# Patient Record
Sex: Female | Born: 1955 | Race: White | Hispanic: No | Marital: Married | Smoking: Former smoker
Health system: Southern US, Community
[De-identification: ages and names within clinical notes are randomized; demographics above are authoritative.]

## PROBLEM LIST (undated history)

## (undated) DIAGNOSIS — K5790 Diverticulosis of intestine, part unspecified, without perforation or abscess without bleeding: Secondary | ICD-10-CM

---

## 2012-02-09 DIAGNOSIS — K635 Polyp of colon: Secondary | ICD-10-CM | POA: Insufficient documentation

## 2016-03-18 DIAGNOSIS — N951 Menopausal and female climacteric states: Secondary | ICD-10-CM | POA: Insufficient documentation

## 2017-04-14 DIAGNOSIS — M47812 Spondylosis without myelopathy or radiculopathy, cervical region: Secondary | ICD-10-CM | POA: Insufficient documentation

## 2021-08-29 ENCOUNTER — Other Ambulatory Visit: Payer: Self-pay

## 2021-08-29 ENCOUNTER — Emergency Department (HOSPITAL_BASED_OUTPATIENT_CLINIC_OR_DEPARTMENT_OTHER): Payer: BC Managed Care – PPO

## 2021-08-29 ENCOUNTER — Inpatient Hospital Stay (HOSPITAL_BASED_OUTPATIENT_CLINIC_OR_DEPARTMENT_OTHER)
Admission: EM | Admit: 2021-08-29 | Discharge: 2021-09-07 | DRG: 391 | Disposition: A | Discharge status: Home / Self Care | Payer: BC Managed Care – PPO | Attending: Internal Medicine | Admitting: Internal Medicine

## 2021-08-29 ENCOUNTER — Encounter (HOSPITAL_BASED_OUTPATIENT_CLINIC_OR_DEPARTMENT_OTHER): Payer: Self-pay

## 2021-08-29 DIAGNOSIS — Z801 Family history of malignant neoplasm of trachea, bronchus and lung: Secondary | ICD-10-CM | POA: Diagnosis not present

## 2021-08-29 DIAGNOSIS — K5732 Diverticulitis of large intestine without perforation or abscess without bleeding: Secondary | ICD-10-CM | POA: Diagnosis present

## 2021-08-29 DIAGNOSIS — Z87891 Personal history of nicotine dependence: Secondary | ICD-10-CM

## 2021-08-29 DIAGNOSIS — L259 Unspecified contact dermatitis, unspecified cause: Secondary | ICD-10-CM | POA: Diagnosis present

## 2021-08-29 DIAGNOSIS — K567 Ileus, unspecified: Secondary | ICD-10-CM | POA: Diagnosis present

## 2021-08-29 DIAGNOSIS — Z86010 Personal history of colonic polyps: Secondary | ICD-10-CM | POA: Diagnosis not present

## 2021-08-29 DIAGNOSIS — E876 Hypokalemia: Secondary | ICD-10-CM | POA: Diagnosis present

## 2021-08-29 DIAGNOSIS — Z20822 Contact with and (suspected) exposure to covid-19: Secondary | ICD-10-CM | POA: Diagnosis present

## 2021-08-29 DIAGNOSIS — K5792 Diverticulitis of intestine, part unspecified, without perforation or abscess without bleeding: Secondary | ICD-10-CM

## 2021-08-29 DIAGNOSIS — D6489 Other specified anemias: Secondary | ICD-10-CM | POA: Diagnosis present

## 2021-08-29 DIAGNOSIS — E860 Dehydration: Secondary | ICD-10-CM | POA: Diagnosis present

## 2021-08-29 DIAGNOSIS — I700 Atherosclerosis of aorta: Secondary | ICD-10-CM | POA: Diagnosis present

## 2021-08-29 DIAGNOSIS — N739 Female pelvic inflammatory disease, unspecified: Secondary | ICD-10-CM

## 2021-08-29 DIAGNOSIS — K651 Peritoneal abscess: Secondary | ICD-10-CM | POA: Diagnosis present

## 2021-08-29 DIAGNOSIS — R109 Unspecified abdominal pain: Secondary | ICD-10-CM

## 2021-08-29 DIAGNOSIS — K5720 Diverticulitis of large intestine with perforation and abscess without bleeding: Principal | ICD-10-CM | POA: Diagnosis present

## 2021-08-29 HISTORY — DX: Diverticulosis of intestine, part unspecified, without perforation or abscess without bleeding: K57.90

## 2021-08-29 LAB — CBC WITH DIFFERENTIAL/PLATELET
Abs Immature Granulocytes: 0.06 10*3/uL (ref 0.00–0.07)
Basophils Absolute: 0 10*3/uL (ref 0.0–0.1)
Basophils Relative: 0 %
Eosinophils Absolute: 0 10*3/uL (ref 0.0–0.5)
Eosinophils Relative: 0 %
HCT: 42.4 % (ref 36.0–46.0)
Hemoglobin: 14.3 g/dL (ref 12.0–15.0)
Immature Granulocytes: 0 %
Lymphocytes Relative: 5 %
Lymphs Abs: 0.7 10*3/uL (ref 0.7–4.0)
MCH: 31.6 pg (ref 26.0–34.0)
MCHC: 33.7 g/dL (ref 30.0–36.0)
MCV: 93.8 fL (ref 80.0–100.0)
Monocytes Absolute: 0.6 10*3/uL (ref 0.1–1.0)
Monocytes Relative: 4 %
Neutro Abs: 13.1 10*3/uL — ABNORMAL HIGH (ref 1.7–7.7)
Neutrophils Relative %: 91 %
Platelets: 252 10*3/uL (ref 150–400)
RBC: 4.52 MIL/uL (ref 3.87–5.11)
RDW: 11.9 % (ref 11.5–15.5)
WBC: 14.4 10*3/uL — ABNORMAL HIGH (ref 4.0–10.5)
nRBC: 0 % (ref 0.0–0.2)

## 2021-08-29 LAB — URINALYSIS, MICROSCOPIC (REFLEX)

## 2021-08-29 LAB — COMPREHENSIVE METABOLIC PANEL
ALT: 16 U/L (ref 0–44)
AST: 17 U/L (ref 15–41)
Albumin: 3.9 g/dL (ref 3.5–5.0)
Alkaline Phosphatase: 49 U/L (ref 38–126)
Anion gap: 9 (ref 5–15)
BUN: 15 mg/dL (ref 8–23)
CO2: 27 mmol/L (ref 22–32)
Calcium: 8.9 mg/dL (ref 8.9–10.3)
Chloride: 102 mmol/L (ref 98–111)
Creatinine, Ser: 0.86 mg/dL (ref 0.44–1.00)
GFR, Estimated: >60 mL/min (ref >60)
Glucose, Bld: 133 mg/dL — ABNORMAL HIGH (ref 70–99)
Potassium: 3.9 mmol/L (ref 3.5–5.1)
Sodium: 138 mmol/L (ref 135–145)
Total Bilirubin: 0.6 mg/dL (ref 0.3–1.2)
Total Protein: 7.4 g/dL (ref 6.5–8.1)

## 2021-08-29 LAB — URINALYSIS, ROUTINE W REFLEX MICROSCOPIC
Bilirubin Urine: NEGATIVE
Glucose, UA: NEGATIVE mg/dL
Ketones, ur: NEGATIVE mg/dL
Leukocytes,Ua: NEGATIVE
Nitrite: NEGATIVE
Protein, ur: NEGATIVE mg/dL
Specific Gravity, Urine: 1.01 (ref 1.005–1.030)
pH: 7 (ref 5.0–8.0)

## 2021-08-29 LAB — RESP PANEL BY RT-PCR (FLU A&B, COVID) ARPGX2
Influenza A by PCR: NEGATIVE
Influenza B by PCR: NEGATIVE
SARS Coronavirus 2 by RT PCR: NEGATIVE

## 2021-08-29 LAB — LACTIC ACID, PLASMA: Lactic Acid, Venous: 1.2 mmol/L (ref 0.5–1.9)

## 2021-08-29 LAB — LIPASE, BLOOD: Lipase: 25 U/L (ref 11–51)

## 2021-08-29 MED ORDER — ONDANSETRON HCL 4 MG/2ML IJ SOLN
4.0000 mg | Freq: Once | INTRAMUSCULAR | Status: AC
  Administered 2021-08-29: 4 mg via INTRAVENOUS
  Filled 2021-08-29: qty 2

## 2021-08-29 MED ORDER — HYDROMORPHONE HCL 1 MG/ML IJ SOLN
1.0000 mg | Freq: Once | INTRAMUSCULAR | Status: AC
  Administered 2021-08-29: 1 mg via INTRAVENOUS
  Filled 2021-08-29: qty 1

## 2021-08-29 MED ORDER — MORPHINE SULFATE (PF) 4 MG/ML IV SOLN
4.0000 mg | Freq: Once | INTRAVENOUS | Status: AC
  Administered 2021-08-29: 4 mg via INTRAVENOUS
  Filled 2021-08-29: qty 1

## 2021-08-29 MED ORDER — HYDRALAZINE HCL 20 MG/ML IJ SOLN: 10.0000 mg | Freq: Four times a day (QID) | INTRAMUSCULAR | Status: DC | PRN

## 2021-08-29 MED ORDER — PIPERACILLIN-TAZOBACTAM 3.375 G IVPB 30 MIN
3.3750 g | Freq: Once | INTRAVENOUS | Status: AC
  Administered 2021-08-29: 3.375 g via INTRAVENOUS
  Filled 2021-08-29: qty 50

## 2021-08-29 MED ORDER — LACTATED RINGERS IV BOLUS
1000.0000 mL | Freq: Once | INTRAVENOUS | Status: AC
  Administered 2021-08-29: 1000 mL via INTRAVENOUS

## 2021-08-29 MED ORDER — ONDANSETRON HCL 4 MG/2ML IJ SOLN
4.0000 mg | Freq: Four times a day (QID) | INTRAMUSCULAR | Status: DC | PRN
  Administered 2021-08-29 – 2021-09-03 (×6): 4 mg via INTRAVENOUS
  Filled 2021-08-29 (×7): qty 2

## 2021-08-29 MED ORDER — BISACODYL 10 MG RE SUPP: 10.0000 mg | Freq: Every day | RECTAL | Status: DC | PRN

## 2021-08-29 MED ORDER — ACETAMINOPHEN 325 MG PO TABS
650.0000 mg | ORAL_TABLET | Freq: Four times a day (QID) | ORAL | Status: DC | PRN
  Administered 2021-09-03 – 2021-09-07 (×6): 650 mg via ORAL
  Filled 2021-08-29 (×7): qty 2

## 2021-08-29 MED ORDER — SODIUM CHLORIDE 0.9 % IV SOLN: INTRAVENOUS | Status: DC

## 2021-08-29 MED ORDER — ONDANSETRON HCL 4 MG PO TABS
4.0000 mg | ORAL_TABLET | Freq: Four times a day (QID) | ORAL | Status: DC | PRN
  Administered 2021-09-05: 4 mg via ORAL
  Filled 2021-08-29: qty 1

## 2021-08-29 MED ORDER — PIPERACILLIN-TAZOBACTAM 3.375 G IVPB 30 MIN: 3.3750 g | Freq: Three times a day (TID) | INTRAVENOUS | Status: DC

## 2021-08-29 MED ORDER — MORPHINE SULFATE (PF) 2 MG/ML IV SOLN
1.0000 mg | INTRAVENOUS | Status: DC | PRN
  Administered 2021-08-29 – 2021-08-30 (×3): 2 mg via INTRAVENOUS
  Filled 2021-08-29 (×4): qty 1

## 2021-08-29 MED ORDER — SODIUM CHLORIDE 0.9% FLUSH
3.0000 mL | Freq: Two times a day (BID) | INTRAVENOUS | Status: DC
  Administered 2021-08-30 – 2021-09-06 (×9): 3 mL via INTRAVENOUS

## 2021-08-29 MED ORDER — ACETAMINOPHEN 650 MG RE SUPP: 650.0000 mg | Freq: Four times a day (QID) | RECTAL | Status: DC | PRN

## 2021-08-29 MED ORDER — PIPERACILLIN-TAZOBACTAM 3.375 G IVPB
3.3750 g | Freq: Three times a day (TID) | INTRAVENOUS | Status: DC
  Administered 2021-08-29 – 2021-09-07 (×26): 3.375 g via INTRAVENOUS
  Filled 2021-08-29 (×27): qty 50

## 2021-08-29 MED ORDER — IOHEXOL 300 MG/ML  SOLN
100.0000 mL | Freq: Once | INTRAMUSCULAR | Status: AC | PRN
  Administered 2021-08-29: 100 mL via INTRAVENOUS

## 2021-08-29 MED ORDER — ALBUTEROL SULFATE (2.5 MG/3ML) 0.083% IN NEBU: 2.5000 mg | INHALATION_SOLUTION | RESPIRATORY_TRACT | Status: DC | PRN

## 2021-08-29 NOTE — H&P (Signed)
History and Physical    Jaclyn York VZD:[id number] DOB: 04-05-1956 DOA: 08/29/2021  PCP: Wayland Salinas, MD  Patient coming from: Home  I have personally briefly reviewed patient's old medical records in [place]  Chief Complaint: Worsening lower abdominal pain  HPI: Jaclyn York is a 65 y.o. female with medical history significant of diverticulosis per patient from last colonoscopy about 3 to 4 years ago, history of colonic polyps presenting to the ED at [facility] with a 1-2 week history of worsening cramping lower abdominal pain with a feeling of bloating and gassiness.  Patient states over the past 24 hours lower abdominal pain has worsened significantly.  Patient stated woke up this morning took some Pepto-Bismol with no significant improvement with abdominal pain.  Patient complained of significant weakness to the point whereby she could not get off the floor.  Patient does endorse some diaphoresis/clamminess, nausea, 2 episodes of bilious emesis early on this morning with no further episodes.  Patient does endorse some chills, lightheadedness, dizziness.  Patient denies any fevers, no chest pain, no shortness of breath, no diarrhea, no constipation, no melena, no hematemesis, no hematochezia.  Patient stated last bowel movement was 1 day prior to admission.  Patient endorses flatus.  Patient denies any prior history of acute diverticulitis.  ED Course: Patient seen in the ED, comprehensive metabolic profile unremarkable.  Lactic acid level at 1.2.  CBC with a leukocytosis with a white count of 14.4 otherwise within normal limits.  COVID-19 PCR and influenza PCR negative.  Urinalysis nitrite negative, leukocytes negative, 0-5 WBCs.  CT abdomen and pelvis done with findings consistent with sigmoid diverticulitis with small focal microperforation noted, concern for focal ileus, aortic atherosclerosis noted.  Patient given a dose of IV Dilaudid in the ED, received a  dose of IV Zosyn.  General surgery consulted and medical admission recommended.  Review of Systems: As per HPI otherwise all other systems reviewed and are negative.  Past Medical History:  Diagnosis Date   Diverticulosis     History reviewed. No pertinent surgical history.  Social History  reports that she has quit smoking. Her smoking use included cigarettes. She has never used smokeless tobacco. She reports current alcohol use. She reports that she does not use drugs.  No Known Allergies  Family History  Problem Relation Age of Onset   Diverticulosis Mother    Lung cancer Father    Mother alive age 31 with a history of diverticulosis, degenerative disc disease.  Father deceased age 67 from lung cancer.  Prior to Admission medications   Not on File    Physical Exam: Vitals:   08/29/21 1330 08/29/21 1400 08/29/21 1610 08/29/21 1656  BP: 110/63 97/76 116/60 103/61  Pulse: 84 96 89 78  Resp: 18 18 20 18   Temp:    98.5 F (36.9 C)  TempSrc:    Oral  SpO2: 97% 100% 99% 90%  Weight:      Height:        Constitutional: NAD, calm, comfortable.  Somewhat drowsy. Vitals:   08/29/21 1330 08/29/21 1400 08/29/21 1610 08/29/21 1656  BP: 110/63 97/76 116/60 103/61  Pulse: 84 96 89 78  Resp: 18 18 20 18   Temp:    98.5 F (36.9 C)  TempSrc:    Oral  SpO2: 97% 100% 99% 90%  Weight:      Height:       Eyes: PERRL, lids and conjunctivae normal ENMT: Mucous membranes are moist.  Minimal erythema noted in the posterior pharynx.  No exudate noted.  Normal dentition.  Neck: normal, supple, no masses, no thyromegaly Respiratory: clear to auscultation bilaterally, no wheezing, no crackles. Normal respiratory effort. No accessory muscle use.  Cardiovascular: Regular rate and rhythm, no murmurs / rubs / gallops. No extremity edema. 2+ pedal pulses. No carotid bruits.  Abdomen: Soft, nondistended, positive bowel sounds, tenderness to palpation in the lower abdominal region  bilaterally.  No rebound.  No guarding.   Musculoskeletal: no clubbing / cyanosis. No joint deformity upper and lower extremities. Good ROM, no contractures. Normal muscle tone.  Skin: no rashes, lesions, ulcers. No induration Neurologic: CN 2-12 grossly intact. Sensation intact, DTR normal. Strength 5/5 in all 4.  Psychiatric: Normal judgment and insight. Alert and oriented x 3. Normal mood.    Labs on Admission: I have personally reviewed following labs and imaging studies  CBC: Recent Labs  Lab 08/29/21 1124  WBC 14.4*  NEUTROABS 13.1*  HGB 14.3  HCT 42.4  MCV 93.8  PLT 220    Basic Metabolic Panel: Recent Labs  Lab 08/29/21 1124  NA 138  K 3.9  CL 102  CO2 27  GLUCOSE 133*  BUN 15  CREATININE 0.86  CALCIUM 8.9    GFR: Estimated Creatinine Clearance: 72.9 mL/min (by C-G formula based on SCr of 0.86 mg/dL).  Liver Function Tests: Recent Labs  Lab 08/29/21 1124  AST 17  ALT 16  ALKPHOS 49  BILITOT 0.6  PROT 7.4  ALBUMIN 3.9    Urine analysis:    Component Value Date/Time   COLORURINE YELLOW 08/29/2021 [place] 08/29/2021 1124   LABSPEC 1.010 08/29/2021 1124   PHURINE 7.0 08/29/2021 1124   GLUCOSEU NEGATIVE 08/29/2021 1124   HGBUR TRACE (A) 08/29/2021 1124   BILIRUBINUR NEGATIVE 08/29/2021 1124   [place] 08/29/2021 1124   PROTEINUR NEGATIVE 08/29/2021 1124   NITRITE NEGATIVE 08/29/2021 1124   LEUKOCYTESUR NEGATIVE 08/29/2021 1124    Radiological Exams on Admission: CT ABDOMEN PELVIS W CONTRAST  Result Date: 08/29/2021 CLINICAL DATA:  Left lower quadrant abdomen pain assess for diverticulitis. EXAM: CT ABDOMEN AND PELVIS WITH CONTRAST TECHNIQUE: Multidetector CT imaging of the abdomen and pelvis was performed using the standard protocol following bolus administration of intravenous contrast. CONTRAST:  140mL OMNIPAQUE IOHEXOL 300 MG/ML  SOLN COMPARISON:  None. FINDINGS: Lower chest: No acute abnormality. Hepatobiliary:  No focal liver abnormality is seen. No gallstones, gallbladder wall thickening, or biliary dilatation. Pancreas: Unremarkable. No pancreatic ductal dilatation or surrounding inflammatory changes. Spleen: Normal in size without focal abnormality. Adrenals/Urinary Tract: Adrenal glands are unremarkable. Kidneys are normal, without renal calculi, focal lesion, or hydronephrosis. Bladder is unremarkable. Left parapelvic renal cysts are identified. Stomach/Bowel: There is stranding inflammation surrounding the sigmoid colon with a few small foci of air adjacent to the area inflammation suggesting small focal micro perforation. Free fluid is identified in the pelvis. There is mild dilatation of the small bowel loops in the pelvis likely secondary to focal ileus. The small bowel loops are otherwise normal. The stomach is normal. The appendix is normal. Vascular/Lymphatic: Aortic atherosclerosis. No enlarged abdominal or pelvic lymph nodes. Reproductive: Uterine fibroids are noted. No definite adnexal masses are noted. Other: Free fluid in the pelvis as previously described. Musculoskeletal: Degenerative joint changes of the lower lumbar spine are identified. IMPRESSION: 1. Findings consistent with sigmoid diverticulitis with a few small foci of air adjacent to the area inflammation suggesting small focal micro perforation.  Free fluid is identified in the pelvis. 2. Mild dilatation of the small bowel loops in the pelvis likely secondary to focal ileus. 3. Aortic atherosclerosis. Aortic Atherosclerosis (ICD10-I70.0). These results were called by telephone at the time of interpretation on 08/29/2021 at 12:57 pm to provider M[facility] , who verbally acknowledged these results. Electronically Signed   By: Abelardo Diesel M.D.   On: 08/29/2021 12:57    EKG: Not done.  Assessment/Plan Principal Problem:   Sigmoid diverticulitis Active Problems:   Ileus ([place])   Dehydration   #1 acute sigmoid diverticulitis with  microperforation -Patient with history of diverticulosis (noted on colonoscopy 3 to 4 years ago per patient), no prior history of acute diverticulitis. -Keep patient n.p.o. except ice chips. -IV fluids, IV antiemetics, IV pain management. -Placed on IV Zosyn. -General surgery consulted and following.  2.  Ileus -Noted on CT abdomen and pelvis. -Bowel rest. -Mobilize. -Minimize narcotics.  3.  Dehydration IV fluids.  DVT prophylaxis: SCDs Code Status:   Full Family Communication:  Updated patient.  No family at bedside Disposition Plan:   Patient is from:  Home  Anticipated DC to:  Home  Anticipated DC date:  3 to 4 days  Anticipated DC barriers: Clinical improvement.  Consults called: General surgery: Dr. Ninfa Linden 08/29/2021 Admission status:  Admit to inpatient/telemetry  Severity of Illness: The appropriate patient status for this patient is INPATIENT. Inpatient status is judged to be reasonable and necessary in order to provide the required intensity of service to ensure the patient's safety. The patient's presenting symptoms, physical exam findings, and initial radiographic and laboratory data in the context of their chronic comorbidities is felt to place them at high risk for further clinical deterioration. Furthermore, it is not anticipated that the patient will be medically stable for discharge from the hospital within 2 midnights of admission.   * I certify that at the point of admission it is my clinical judgment that the patient will require inpatient hospital care spanning beyond 2 midnights from the point of admission due to high intensity of service, high risk for further deterioration and high frequency of surveillance required.*    Irine Seal MD [facility]  How to contact the [facility] Attending or Consulting provider [place] or covering provider during after hours [place], for this patient?   Check the care team in [facility] and look for a) attending/consulting  [facility] provider listed and b) the [facility] team listed Log into [email or web address] and use [place]'s universal password to access. If you do not have the password, please contact the hospital operator. Locate the [facility] provider you are looking for under [facility] and page to a number that you can be directly reached. If you still have difficulty reaching the provider, please page the [facility] (Director on Call) for the Hospitalists listed on amion for assistance.  08/29/2021, 6:34 PM

## 2021-08-29 NOTE — ED Provider Notes (Signed)
[place] EMERGENCY DEPARTMENT Provider Note   CSN: [id number] Arrival date & time: 08/29/21  1058     History Chief Complaint  Patient presents with   Abdominal Pain    Nausea,     Jaclyn York is a 65 y.o. female who presents the emergency department for evaluation of abdominal pain nausea and vomiting.  Patient states that for the last week and a half she has had lower crampy abdominal pain that significantly worsened over the last 24 hours.  She states she has had multiple episodes of nonbloody nonbilious vomiting and nonbloody diarrhea.  She endorses diaphoresis but no documented fevers at home.  Denies chest pain, shortness of breath, headache, cough or other systemic symptoms.   Abdominal Pain Associated symptoms: diarrhea, nausea and vomiting   Associated symptoms: no chest pain, no chills, no cough, no dysuria, no fever, no hematuria, no shortness of breath and no sore throat       History reviewed. No pertinent past medical history.  Patient Active Problem List   Diagnosis Date Noted   Sigmoid diverticulitis 08/29/2021    History reviewed. No pertinent surgical history.   OB History   No obstetric history on file.     History reviewed. No pertinent family history.     Home Medications Prior to Admission medications   Not on File    Allergies    Patient has no known allergies.  Review of Systems   Review of Systems  Constitutional:  Negative for chills and fever.  HENT:  Negative for ear pain and sore throat.   Eyes:  Negative for pain and visual disturbance.  Respiratory:  Negative for cough and shortness of breath.   Cardiovascular:  Negative for chest pain and palpitations.  Gastrointestinal:  Positive for abdominal pain, diarrhea, nausea and vomiting.  Genitourinary:  Negative for dysuria and hematuria.  Musculoskeletal:  Negative for arthralgias and back pain.  Skin:  Negative for color change and rash.  Neurological:   Negative for seizures and syncope.  All other systems reviewed and are negative.  Physical Exam Updated Vital Signs BP 97/76   Pulse 96   Temp 98.5 F (36.9 C) (Oral)   Resp 18   Ht 5\' 8"  (1.727 m)   Wt 76.7 kg   SpO2 100%   BMI 25.70 kg/m   Physical Exam Vitals and nursing note reviewed.  Constitutional:      General: She is not in acute distress.    Appearance: She is well-developed. She is ill-appearing.  HENT:     Head: Normocephalic and atraumatic.  Eyes:     Conjunctiva/sclera: Conjunctivae normal.  Cardiovascular:     Rate and Rhythm: Normal rate and regular rhythm.     Heart sounds: No murmur heard. Pulmonary:     Effort: Pulmonary effort is normal. No respiratory distress.     Breath sounds: Normal breath sounds.  Abdominal:     Palpations: Abdomen is soft.     Tenderness: There is abdominal tenderness in the right lower quadrant and left lower quadrant.  Musculoskeletal:     Cervical back: Neck supple.  Skin:    General: Skin is warm and dry.  Neurological:     Mental Status: She is alert.    ED Results / Procedures / Treatments   Labs (all labs ordered are listed, but only abnormal results are displayed) Labs Reviewed  CBC WITH DIFFERENTIAL/PLATELET - Abnormal; Notable for the following components:      Result  Value   WBC 14.4 (*)    Neutro Abs 13.1 (*)    All other components within normal limits  COMPREHENSIVE METABOLIC PANEL - Abnormal; Notable for the following components:   Glucose, Bld 133 (*)    All other components within normal limits  URINALYSIS, ROUTINE W REFLEX MICROSCOPIC - Abnormal; Notable for the following components:   Hgb urine dipstick TRACE (*)    All other components within normal limits  URINALYSIS, MICROSCOPIC (REFLEX) - Abnormal; Notable for the following components:   Bacteria, UA MANY (*)    All other components within normal limits  RESP PANEL BY RT-PCR (FLU A&B, COVID) ARPGX2  LIPASE, BLOOD  LACTIC ACID, PLASMA     EKG None  Radiology CT ABDOMEN PELVIS W CONTRAST  Result Date: 08/29/2021 CLINICAL DATA:  Left lower quadrant abdomen pain assess for diverticulitis. EXAM: CT ABDOMEN AND PELVIS WITH CONTRAST TECHNIQUE: Multidetector CT imaging of the abdomen and pelvis was performed using the standard protocol following bolus administration of intravenous contrast. CONTRAST:  165mL OMNIPAQUE IOHEXOL 300 MG/ML  SOLN COMPARISON:  None. FINDINGS: Lower chest: No acute abnormality. Hepatobiliary: No focal liver abnormality is seen. No gallstones, gallbladder wall thickening, or biliary dilatation. Pancreas: Unremarkable. No pancreatic ductal dilatation or surrounding inflammatory changes. Spleen: Normal in size without focal abnormality. Adrenals/Urinary Tract: Adrenal glands are unremarkable. Kidneys are normal, without renal calculi, focal lesion, or hydronephrosis. Bladder is unremarkable. Left parapelvic renal cysts are identified. Stomach/Bowel: There is stranding inflammation surrounding the sigmoid colon with a few small foci of air adjacent to the area inflammation suggesting small focal micro perforation. Free fluid is identified in the pelvis. There is mild dilatation of the small bowel loops in the pelvis likely secondary to focal ileus. The small bowel loops are otherwise normal. The stomach is normal. The appendix is normal. Vascular/Lymphatic: Aortic atherosclerosis. No enlarged abdominal or pelvic lymph nodes. Reproductive: Uterine fibroids are noted. No definite adnexal masses are noted. Other: Free fluid in the pelvis as previously described. Musculoskeletal: Degenerative joint changes of the lower lumbar spine are identified. IMPRESSION: 1. Findings consistent with sigmoid diverticulitis with a few small foci of air adjacent to the area inflammation suggesting small focal micro perforation. Free fluid is identified in the pelvis. 2. Mild dilatation of the small bowel loops in the pelvis likely secondary  to focal ileus. 3. Aortic atherosclerosis. Aortic Atherosclerosis (ICD10-I70.0). These results were called by telephone at the time of interpretation on 08/29/2021 at 12:57 pm to provider Jaclyn York [facility] , who verbally acknowledged these results. Electronically Signed   By: Abelardo Diesel M.D.   On: 08/29/2021 12:57    Procedures Procedures   Medications Ordered in ED Medications  morphine 4 MG/ML injection 4 mg (4 mg Intravenous Given 08/29/21 1145)  ondansetron (ZOFRAN) injection 4 mg (4 mg Intravenous Given 08/29/21 1144)  lactated ringers bolus 1,000 mL ( Intravenous Stopped 08/29/21 1301)  iohexol (OMNIPAQUE) 300 MG/ML solution 100 mL (100 mLs Intravenous Contrast Given 08/29/21 1239)  piperacillin-tazobactam (ZOSYN) IVPB 3.375 g (0 g Intravenous Stopped 08/29/21 1359)  morphine 4 MG/ML injection 4 mg (4 mg Intravenous Given 08/29/21 1404)  ondansetron (ZOFRAN) injection 4 mg (4 mg Intravenous Given 08/29/21 1403)    ED Course  I have reviewed the triage vital signs and the nursing notes.  Pertinent labs & imaging results that were available during my care of the patient were reviewed by me and considered in my medical decision making (see chart for details).  MDM Rules/Calculators/A&P                           Patient seen emergency department for evaluation of abdominal pain.  Physical exam reveals tenderness in the left lower quadrant as well as an overall ill-appearing patient.  Laboratory evaluation with a leukocytosis to 14.4 but is otherwise unremarkable.  CT abdomen pelvis with sigmoid diverticulitis with microperforation as well as a secondary focal ileus in the small bowel loops.  Patient started on Zosyn and surgery consulted who recommends medical admission and they will consult.  Patient admitted. Final Clinical Impression(s) / ED Diagnoses Final diagnoses:  Diverticulitis    Rx / DC Orders ED Discharge Orders     None        Aylani Spurlock, MD 08/29/21  1606

## 2021-08-29 NOTE — ED Provider Notes (Signed)
  Physical Exam  BP 97/76   Pulse 96   Temp 98.5 F (36.9 C) (Oral)   Resp 18   Ht 5\' 8"  (1.727 m)   Wt 76.7 kg   SpO2 100%   BMI 25.70 kg/m     ED Course/Procedures     Procedures  MDM    65 year old female presenting with abd pain, microperf'd diverticulitis, admitted to [facility]yn.  The patient's pain medicine was reordered.  [facility] came and picked up the patient and transferred her for admission.  She remained hemodynamically stable prior to transfer.       Regan Lemming, MD 08/29/21 1759

## 2021-08-29 NOTE — Consult Note (Signed)
CC: abdominal pain  Requesting provider: Dr. Irine Seal  HPI: Jaclyn York is an 65 y.o. female who is here for evaluation of diverticulitis.  She was transferred from [facility] today.  She reports that for the last 2 weeks she has been having some intermittent low, crampy abdominal pain.  It became worse over the past day.  She had several episodes of emesis and diarrhea.  The pain was moderate in intensity. She has no previous history of diverticulitis.  She undergoes endoscopy every 3 to 5 years for polyps in her colon.  She reports that is probably been at least 3 years since her last endoscopy which is usually done in [place].  She denies fevers or chills.  She is otherwise without complaints.  Past Medical History:  Diagnosis Date   Diverticulosis     History reviewed. No pertinent surgical history.  Hemorrhoidectomy  Family History  Problem Relation Age of Onset   Diverticulosis Mother    Lung cancer Father     Social:  reports that she has quit smoking. Her smoking use included cigarettes. She has never used smokeless tobacco. She reports current alcohol use. She reports that she does not use drugs.  Allergies: No Known Allergies  Medications: I have reviewed the patient's current medications.  Results for orders placed or performed during the hospital encounter of 08/29/21 (from the past 48 hour(s))  CBC with Differential     Status: Abnormal   Collection Time: 08/29/21 11:24 AM  Result Value Ref Range   WBC 14.4 (H) 4.0 - 10.5 K/uL   RBC 4.52 3.87 - 5.11 MIL/uL   Hemoglobin 14.3 12.0 - 15.0 g/dL   HCT 42.4 36.0 - 46.0 %   MCV 93.8 80.0 - 100.0 fL   MCH 31.6 26.0 - 34.0 pg   MCHC 33.7 30.0 - 36.0 g/dL   RDW 11.9 11.5 - 15.5 %   Platelets 252 150 - 400 K/uL   nRBC 0.0 0.0 - 0.2 %   Neutrophils Relative % 91 %   Neutro Abs 13.1 (H) 1.7 - 7.7 K/uL   Lymphocytes Relative 5 %   Lymphs Abs 0.7 0.7 - 4.0 K/uL   Monocytes Relative 4 %    Monocytes Absolute 0.6 0.1 - 1.0 K/uL   Eosinophils Relative 0 %   Eosinophils Absolute 0.0 0.0 - 0.5 K/uL   Basophils Relative 0 %   Basophils Absolute 0.0 0.0 - 0.1 K/uL   Immature Granulocytes 0 %   Abs Immature Granulocytes 0.06 0.00 - 0.07 K/uL    Comment: Performed at [facility], [address] [place], [place] [address]  Comprehensive metabolic panel     Status: Abnormal   Collection Time: 08/29/21 11:24 AM  Result Value Ref Range   Sodium 138 135 - 145 mmol/L   Potassium 3.9 3.5 - 5.1 mmol/L   Chloride 102 98 - 111 mmol/L   CO2 27 22 - 32 mmol/L   Glucose, Bld 133 (H) 70 - 99 mg/dL    Comment: Glucose reference range applies only to samples taken after fasting for at least 8 hours.   BUN 15 8 - 23 mg/dL   Creatinine, Ser 0.86 0.44 - 1.00 mg/dL   Calcium 8.9 8.9 - 10.3 mg/dL   Total Protein 7.4 6.5 - 8.1 g/dL   Albumin 3.9 3.5 - 5.0 g/dL   AST 17 15 - 41 U/L   ALT 16 0 - 44 U/L  Alkaline Phosphatase 49 38 - 126 U/L   Total Bilirubin 0.6 0.3 - 1.2 mg/dL   GFR, Estimated >60 >60 mL/min    Comment: (NOTE) Calculated using the CKD-EPI Creatinine Equation (2021)    Anion gap 9 5 - 15    Comment: Performed at [facility], [address] [place], [place] [address]  Lipase, blood     Status: None   Collection Time: 08/29/21 11:24 AM  Result Value Ref Range   Lipase 25 11 - 51 U/L    Comment: Performed at [facility], [address] [place], [place] [address]  Urinalysis, Routine w reflex microscopic Urine, Clean Catch     Status: Abnormal   Collection Time: 08/29/21 11:24 AM  Result Value Ref Range   Color, Urine YELLOW YELLOW   APPearance CLEAR CLEAR   Specific Gravity, Urine 1.010 1.005 - 1.030   pH 7.0 5.0 - 8.0   Glucose, UA NEGATIVE NEGATIVE mg/dL   Hgb urine dipstick TRACE (A) NEGATIVE   Bilirubin Urine NEGATIVE NEGATIVE   Ketones, ur NEGATIVE NEGATIVE mg/dL   Protein, ur NEGATIVE NEGATIVE mg/dL   Nitrite NEGATIVE NEGATIVE    Leukocytes,Ua NEGATIVE NEGATIVE    Comment: Performed at [facility], [address] [place], [place] [address]  Resp Panel by RT-PCR (Flu A&B, Covid) Nasopharyngeal Swab     Status: None   Collection Time: 08/29/21 11:24 AM   Specimen: Nasopharyngeal Swab; Nasopharyngeal(NP) swabs in vial transport medium  Result Value Ref Range   SARS Coronavirus 2 by RT PCR NEGATIVE NEGATIVE    Comment: (NOTE) SARS-CoV-2 target nucleic acids are NOT DETECTED.  The SARS-CoV-2 RNA is generally detectable in upper respiratory specimens during the acute phase of infection. The lowest concentration of SARS-CoV-2 viral copies this assay can detect is 138 copies/mL. A negative result does not preclude SARS-Cov-2 infection and should not be used as the sole basis for treatment or other patient management decisions. A negative result may occur with  improper specimen collection/handling, submission of specimen other than nasopharyngeal swab, presence of viral mutation(s) within the areas targeted by this assay, and inadequate number of viral copies(<138 copies/mL). A negative result must be combined with clinical observations, patient history, and epidemiological information. The expected result is Negative.  Fact Sheet for Patients:  EntrepreneurPulse.com.au  Fact Sheet for Healthcare Providers:  IncredibleEmployment.be  This test is no t yet approved or cleared by the [place] FDA and  has been authorized for detection and/or diagnosis of SARS-CoV-2 by FDA under an Emergency Use Authorization (EUA). This EUA will remain  in effect (meaning this test can be used) for the duration of the COVID-19 declaration under Section 564(b)(1) of the Act, 21 U.S.C.section 360bbb-3(b)(1), unless the authorization is terminated  or revoked sooner.       Influenza A by PCR NEGATIVE NEGATIVE   Influenza B by PCR NEGATIVE NEGATIVE    Comment: (NOTE) The Xpert  Xpress SARS-CoV-2/FLU/RSV plus assay is intended as an aid in the diagnosis of influenza from Nasopharyngeal swab specimens and should not be used as a sole basis for treatment. Nasal washings and aspirates are unacceptable for Xpert Xpress SARS-CoV-2/FLU/RSV testing.  Fact Sheet for Patients: EntrepreneurPulse.com.au  Fact Sheet for Healthcare Providers: IncredibleEmployment.be  This test is not yet approved or cleared by the [place] FDA and has been authorized for detection and/or diagnosis of SARS-CoV-2 by FDA under an Emergency Use Authorization (EUA). This EUA will  remain in effect (meaning this test can be used) for the duration of the COVID-19 declaration under Section 564(b)(1) of the Act, 21 U.S.C. section 360bbb-3(b)(1), unless the authorization is terminated or revoked.  Performed at [facility], [address] [place], [place] [address]   Lactic acid, plasma     Status: None   Collection Time: 08/29/21 11:24 AM  Result Value Ref Range   Lactic Acid, Venous 1.2 0.5 - 1.9 mmol/L    Comment: Performed at [facility], [address] [place], [place] [address]  Urinalysis, Microscopic (reflex)     Status: Abnormal   Collection Time: 08/29/21 11:24 AM  Result Value Ref Range   RBC / HPF 0-5 0 - 5 RBC/hpf   WBC, UA 0-5 0 - 5 WBC/hpf   Bacteria, UA MANY (A) NONE SEEN   Squamous Epithelial / LPF 6-10 0 - 5    Comment: Performed at [facility], [address] [place], [place] [address]    CT ABDOMEN PELVIS W CONTRAST  Result Date: 08/29/2021 CLINICAL DATA:  Left lower quadrant abdomen pain assess for diverticulitis. EXAM: CT ABDOMEN AND PELVIS WITH CONTRAST TECHNIQUE: Multidetector CT imaging of the abdomen and pelvis was performed using the standard protocol following bolus administration of intravenous contrast. CONTRAST:  170mL OMNIPAQUE IOHEXOL 300 MG/ML  SOLN COMPARISON:  None. FINDINGS:  Lower chest: No acute abnormality. Hepatobiliary: No focal liver abnormality is seen. No gallstones, gallbladder wall thickening, or biliary dilatation. Pancreas: Unremarkable. No pancreatic ductal dilatation or surrounding inflammatory changes. Spleen: Normal in size without focal abnormality. Adrenals/Urinary Tract: Adrenal glands are unremarkable. Kidneys are normal, without renal calculi, focal lesion, or hydronephrosis. Bladder is unremarkable. Left parapelvic renal cysts are identified. Stomach/Bowel: There is stranding inflammation surrounding the sigmoid colon with a few small foci of air adjacent to the area inflammation suggesting small focal micro perforation. Free fluid is identified in the pelvis. There is mild dilatation of the small bowel loops in the pelvis likely secondary to focal ileus. The small bowel loops are otherwise normal. The stomach is normal. The appendix is normal. Vascular/Lymphatic: Aortic atherosclerosis. No enlarged abdominal or pelvic lymph nodes. Reproductive: Uterine fibroids are noted. No definite adnexal masses are noted. Other: Free fluid in the pelvis as previously described. Musculoskeletal: Degenerative joint changes of the lower lumbar spine are identified. IMPRESSION: 1. Findings consistent with sigmoid diverticulitis with a few small foci of air adjacent to the area inflammation suggesting small focal micro perforation. Free fluid is identified in the pelvis. 2. Mild dilatation of the small bowel loops in the pelvis likely secondary to focal ileus. 3. Aortic atherosclerosis. Aortic Atherosclerosis (ICD10-I70.0). These results were called by telephone at the time of interpretation on 08/29/2021 at 12:57 pm to provider MADISON [facility] , who verbally acknowledged these results. Electronically Signed   By: Abelardo Diesel M.D.   On: 08/29/2021 12:57    ROS - all of the below systems have been reviewed with the patient and positives are indicated with bold text General:  chills, fever or night sweats Eyes: blurry vision or double vision ENT: epistaxis or sore throat Allergy/Immunology: itchy/watery eyes or nasal congestion Hematologic/Lymphatic: bleeding problems, blood clots or swollen lymph nodes Endocrine: temperature intolerance or unexpected weight changes Breast: new or changing breast lumps or nipple discharge Resp: cough, shortness of breath, or wheezing CV: chest pain or dyspnea on exertion GI: as per HPI GU: dysuria, trouble voiding, or hematuria MSK: joint  pain or joint stiffness Neuro: TIA or stroke symptoms Derm: pruritus and skin lesion changes Psych: anxiety and depression  PE Blood pressure 103/61, pulse 78, temperature 98.5 F (36.9 C), temperature source Oral, resp. rate 18, height 5\' 8"  (1.727 m), weight 76.7 kg, SpO2 90 %. Constitutional: NAD; conversant; no deformities Eyes: Moist conjunctiva; no lid lag; anicteric; PERRL Lungs: Normal respiratory effort; no tactile fremitus CV: RRR; no palpable thrills; no pitting edema GI: Abd soft with tenderness and guarding in the left lower quadrant which is moderate; no palpable hepatosplenomegaly MSK: Normal range of motion of extremities; no clubbing/cyanosis Psychiatric: Appropriate affect; alert and oriented x3   Results for orders placed or performed during the hospital encounter of 08/29/21 (from the past 48 hour(s))  CBC with Differential     Status: Abnormal   Collection Time: 08/29/21 11:24 AM  Result Value Ref Range   WBC 14.4 (H) 4.0 - 10.5 K/uL   RBC 4.52 3.87 - 5.11 MIL/uL   Hemoglobin 14.3 12.0 - 15.0 g/dL   HCT 42.4 36.0 - 46.0 %   MCV 93.8 80.0 - 100.0 fL   MCH 31.6 26.0 - 34.0 pg   MCHC 33.7 30.0 - 36.0 g/dL   RDW 11.9 11.5 - 15.5 %   Platelets 252 150 - 400 K/uL   nRBC 0.0 0.0 - 0.2 %   Neutrophils Relative % 91 %   Neutro Abs 13.1 (H) 1.7 - 7.7 K/uL   Lymphocytes Relative 5 %   Lymphs Abs 0.7 0.7 - 4.0 K/uL   Monocytes Relative 4 %   Monocytes Absolute 0.6  0.1 - 1.0 K/uL   Eosinophils Relative 0 %   Eosinophils Absolute 0.0 0.0 - 0.5 K/uL   Basophils Relative 0 %   Basophils Absolute 0.0 0.0 - 0.1 K/uL   Immature Granulocytes 0 %   Abs Immature Granulocytes 0.06 0.00 - 0.07 K/uL    Comment: Performed at [facility], [address] [place], [place] [address]  Comprehensive metabolic panel     Status: Abnormal   Collection Time: 08/29/21 11:24 AM  Result Value Ref Range   Sodium 138 135 - 145 mmol/L   Potassium 3.9 3.5 - 5.1 mmol/L   Chloride 102 98 - 111 mmol/L   CO2 27 22 - 32 mmol/L   Glucose, Bld 133 (H) 70 - 99 mg/dL    Comment: Glucose reference range applies only to samples taken after fasting for at least 8 hours.   BUN 15 8 - 23 mg/dL   Creatinine, Ser 0.86 0.44 - 1.00 mg/dL   Calcium 8.9 8.9 - 10.3 mg/dL   Total Protein 7.4 6.5 - 8.1 g/dL   Albumin 3.9 3.5 - 5.0 g/dL   AST 17 15 - 41 U/L   ALT 16 0 - 44 U/L   Alkaline Phosphatase 49 38 - 126 U/L   Total Bilirubin 0.6 0.3 - 1.2 mg/dL   GFR, Estimated >60 >60 mL/min    Comment: (NOTE) Calculated using the CKD-EPI Creatinine Equation (2021)    Anion gap 9 5 - 15    Comment: Performed at [facility], [address] [place], [place] [address]  Lipase, blood     Status: None   Collection Time: 08/29/21 11:24 AM  Result Value Ref Range   Lipase 25 11 - 51 U/L    Comment: Performed at [facility], [address] [place],  [address]  Urinalysis, Routine w  reflex microscopic Urine, Clean Catch     Status: Abnormal   Collection Time: 08/29/21 11:24 AM  Result Value Ref Range   Color, Urine YELLOW YELLOW   APPearance CLEAR CLEAR   Specific Gravity, Urine 1.010 1.005 - 1.030   pH 7.0 5.0 - 8.0   Glucose, UA NEGATIVE NEGATIVE mg/dL   Hgb urine dipstick TRACE (A) NEGATIVE   Bilirubin Urine NEGATIVE NEGATIVE   Ketones, ur NEGATIVE NEGATIVE mg/dL   Protein, ur NEGATIVE NEGATIVE mg/dL   Nitrite NEGATIVE NEGATIVE   Leukocytes,Ua  NEGATIVE NEGATIVE    Comment: Performed at [facility], [address] [place], [place] [address]  Resp Panel by RT-PCR (Flu A&B, Covid) Nasopharyngeal Swab     Status: None   Collection Time: 08/29/21 11:24 AM   Specimen: Nasopharyngeal Swab; Nasopharyngeal(NP) swabs in vial transport medium  Result Value Ref Range   SARS Coronavirus 2 by RT PCR NEGATIVE NEGATIVE    Comment: (NOTE) SARS-CoV-2 target nucleic acids are NOT DETECTED.  The SARS-CoV-2 RNA is generally detectable in upper respiratory specimens during the acute phase of infection. The lowest concentration of SARS-CoV-2 viral copies this assay can detect is 138 copies/mL. A negative result does not preclude SARS-Cov-2 infection and should not be used as the sole basis for treatment or other patient management decisions. A negative result may occur with  improper specimen collection/handling, submission of specimen other than nasopharyngeal swab, presence of viral mutation(s) within the areas targeted by this assay, and inadequate number of viral copies(<138 copies/mL). A negative result must be combined with clinical observations, patient history, and epidemiological information. The expected result is Negative.  Fact Sheet for Patients:  EntrepreneurPulse.com.au  Fact Sheet for Healthcare Providers:  IncredibleEmployment.be  This test is no t yet approved or cleared by the [place] FDA and  has been authorized for detection and/or diagnosis of SARS-CoV-2 by FDA under an Emergency Use Authorization (EUA). This EUA will remain  in effect (meaning this test can be used) for the duration of the COVID-19 declaration under Section 564(b)(1) of the Act, 21 U.S.C.section 360bbb-3(b)(1), unless the authorization is terminated  or revoked sooner.       Influenza A by PCR NEGATIVE NEGATIVE   Influenza B by PCR NEGATIVE NEGATIVE    Comment: (NOTE) The Xpert Xpress  SARS-CoV-2/FLU/RSV plus assay is intended as an aid in the diagnosis of influenza from Nasopharyngeal swab specimens and should not be used as a sole basis for treatment. Nasal washings and aspirates are unacceptable for Xpert Xpress SARS-CoV-2/FLU/RSV testing.  Fact Sheet for Patients: EntrepreneurPulse.com.au  Fact Sheet for Healthcare Providers: IncredibleEmployment.be  This test is not yet approved or cleared by the [place] FDA and has been authorized for detection and/or diagnosis of SARS-CoV-2 by FDA under an Emergency Use Authorization (EUA). This EUA will remain in effect (meaning this test can be used) for the duration of the COVID-19 declaration under Section 564(b)(1) of the Act, 21 U.S.C. section 360bbb-3(b)(1), unless the authorization is terminated or revoked.  Performed at [facility], [address] [place], [place] [address]   Lactic acid, plasma     Status: None   Collection Time: 08/29/21 11:24 AM  Result Value Ref Range   Lactic Acid, Venous 1.2 0.5 - 1.9 mmol/L    Comment: Performed at [facility], [address] [place], [place] [address]  Urinalysis, Microscopic (reflex)     Status: Abnormal   Collection Time:  08/29/21 11:24 AM  Result Value Ref Range   RBC / HPF 0-5 0 - 5 RBC/hpf   WBC, UA 0-5 0 - 5 WBC/hpf   Bacteria, UA MANY (A) NONE SEEN   Squamous Epithelial / LPF 6-10 0 - 5    Comment: Performed at [facility], [address] [place], [place] [address]    CT ABDOMEN PELVIS W CONTRAST  Result Date: 08/29/2021 CLINICAL DATA:  Left lower quadrant abdomen pain assess for diverticulitis. EXAM: CT ABDOMEN AND PELVIS WITH CONTRAST TECHNIQUE: Multidetector CT imaging of the abdomen and pelvis was performed using the standard protocol following bolus administration of intravenous contrast. CONTRAST:  16mL OMNIPAQUE IOHEXOL 300 MG/ML  SOLN COMPARISON:  None. FINDINGS: Lower  chest: No acute abnormality. Hepatobiliary: No focal liver abnormality is seen. No gallstones, gallbladder wall thickening, or biliary dilatation. Pancreas: Unremarkable. No pancreatic ductal dilatation or surrounding inflammatory changes. Spleen: Normal in size without focal abnormality. Adrenals/Urinary Tract: Adrenal glands are unremarkable. Kidneys are normal, without renal calculi, focal lesion, or hydronephrosis. Bladder is unremarkable. Left parapelvic renal cysts are identified. Stomach/Bowel: There is stranding inflammation surrounding the sigmoid colon with a few small foci of air adjacent to the area inflammation suggesting small focal micro perforation. Free fluid is identified in the pelvis. There is mild dilatation of the small bowel loops in the pelvis likely secondary to focal ileus. The small bowel loops are otherwise normal. The stomach is normal. The appendix is normal. Vascular/Lymphatic: Aortic atherosclerosis. No enlarged abdominal or pelvic lymph nodes. Reproductive: Uterine fibroids are noted. No definite adnexal masses are noted. Other: Free fluid in the pelvis as previously described. Musculoskeletal: Degenerative joint changes of the lower lumbar spine are identified. IMPRESSION: 1. Findings consistent with sigmoid diverticulitis with a few small foci of air adjacent to the area inflammation suggesting small focal micro perforation. Free fluid is identified in the pelvis. 2. Mild dilatation of the small bowel loops in the pelvis likely secondary to focal ileus. 3. Aortic atherosclerosis. Aortic Atherosclerosis (ICD10-I70.0). These results were called by telephone at the time of interpretation on 08/29/2021 at 12:57 pm to provider MADISON [facility] , who verbally acknowledged these results. Electronically Signed   By: Abelardo Diesel M.D.   On: 08/29/2021 12:57     A/P: Danay Mckellar is an 66 y.o. female with sigmoid diverticulitis with microperforation  I reviewed her CT scan of the  abdomen and pelvis.  She has an elevated white blood count and signs of microperforation on the scan.  I have discussed the diagnosis with the patient and her daughter in the room.  We discussed the multiple treatment modalities for diverticulitis.  The plan right now would be to attempt conservative management with IV antibiotics and bowel rest.  Should she acutely worsen, she would require emergent surgery with resection and a colostomy.  Hopefully she will improve without the need for surgery.  We will follow her closely.  Coralie Keens, MD [facility] Use [email or web address] to contact on call provider

## 2021-08-29 NOTE — ED Notes (Signed)
Pt aware of UA but unable to urinate at this time.

## 2021-08-29 NOTE — ED Triage Notes (Signed)
Pt states abdominal cramping, for over a week, nausea beginning today, vomited x2, denies diarrhea.  LBM yesterday.

## 2021-08-30 ENCOUNTER — Inpatient Hospital Stay (HOSPITAL_COMMUNITY): Payer: BC Managed Care – PPO

## 2021-08-30 LAB — COMPREHENSIVE METABOLIC PANEL
ALT: 14 U/L (ref 0–44)
AST: 13 U/L — ABNORMAL LOW (ref 15–41)
Albumin: 3.2 g/dL — ABNORMAL LOW (ref 3.5–5.0)
Alkaline Phosphatase: 39 U/L (ref 38–126)
Anion gap: 6 (ref 5–15)
BUN: 17 mg/dL (ref 8–23)
CO2: 29 mmol/L (ref 22–32)
Calcium: 8.4 mg/dL — ABNORMAL LOW (ref 8.9–10.3)
Chloride: 106 mmol/L (ref 98–111)
Creatinine, Ser: 0.85 mg/dL (ref 0.44–1.00)
GFR, Estimated: >60 mL/min (ref >60)
Glucose, Bld: 111 mg/dL — ABNORMAL HIGH (ref 70–99)
Potassium: 4 mmol/L (ref 3.5–5.1)
Sodium: 141 mmol/L (ref 135–145)
Total Bilirubin: 0.9 mg/dL (ref 0.3–1.2)
Total Protein: 6.3 g/dL — ABNORMAL LOW (ref 6.5–8.1)

## 2021-08-30 LAB — CBC WITH DIFFERENTIAL/PLATELET
Abs Immature Granulocytes: 0.05 10*3/uL (ref 0.00–0.07)
Basophils Absolute: 0 10*3/uL (ref 0.0–0.1)
Basophils Relative: 0 %
Eosinophils Absolute: 0 10*3/uL (ref 0.0–0.5)
Eosinophils Relative: 0 %
HCT: 37.3 % (ref 36.0–46.0)
Hemoglobin: 12.6 g/dL (ref 12.0–15.0)
Immature Granulocytes: 0 %
Lymphocytes Relative: 9 %
Lymphs Abs: 1.3 10*3/uL (ref 0.7–4.0)
MCH: 32 pg (ref 26.0–34.0)
MCHC: 33.8 g/dL (ref 30.0–36.0)
MCV: 94.7 fL (ref 80.0–100.0)
Monocytes Absolute: 0.6 10*3/uL (ref 0.1–1.0)
Monocytes Relative: 4 %
Neutro Abs: 11.7 10*3/uL — ABNORMAL HIGH (ref 1.7–7.7)
Neutrophils Relative %: 87 %
Platelets: 220 10*3/uL (ref 150–400)
RBC: 3.94 MIL/uL (ref 3.87–5.11)
RDW: 11.9 % (ref 11.5–15.5)
WBC: 13.7 10*3/uL — ABNORMAL HIGH (ref 4.0–10.5)
nRBC: 0 % (ref 0.0–0.2)

## 2021-08-30 LAB — PROTIME-INR
INR: 1.2 (ref 0.8–1.2)
Prothrombin Time: 15 seconds (ref 11.4–15.2)

## 2021-08-30 LAB — HIV ANTIBODY (ROUTINE TESTING W REFLEX): HIV Screen 4th Generation wRfx: NONREACTIVE

## 2021-08-30 LAB — MAGNESIUM: Magnesium: 2.1 mg/dL (ref 1.7–2.4)

## 2021-08-30 MED ORDER — SENNOSIDES-DOCUSATE SODIUM 8.6-50 MG PO TABS
1.0000 | ORAL_TABLET | Freq: Two times a day (BID) | ORAL | Status: DC
  Administered 2021-08-30 – 2021-09-07 (×15): 1 via ORAL
  Filled 2021-08-30 (×17): qty 1

## 2021-08-30 MED ORDER — MORPHINE SULFATE (PF) 2 MG/ML IV SOLN
2.0000 mg | INTRAVENOUS | Status: DC | PRN
  Administered 2021-08-30 – 2021-08-31 (×4): 4 mg via INTRAVENOUS
  Administered 2021-08-31 – 2021-09-01 (×4): 2 mg via INTRAVENOUS
  Filled 2021-08-30: qty 2
  Filled 2021-08-30: qty 1
  Filled 2021-08-30 (×3): qty 2
  Filled 2021-08-30 (×2): qty 1
  Filled 2021-08-30: qty 2

## 2021-08-30 NOTE — Progress Notes (Signed)
Subjective/Chief Complaint: Feels better than when she came in   Objective: Vital signs in last 24 hours: Temp:  [97.9 F (36.6 C)-98.5 F (36.9 C)] 98.3 F (36.8 C) (11/13 0543) Pulse Rate:  [65-96] 66 (11/13 0543) Resp:  [16-20] 18 (11/13 0543) BP: (97-135)/(53-89) 104/56 (11/13 0543) SpO2:  [90 %-100 %] 94 % (11/13 0543) Weight:  [76.7 kg] 76.7 kg (11/12 1107) Last BM Date: 08/28/21  Intake/Output from previous day: 11/12 0701 - 11/13 0700 In: 1842.1 [P.O.:30; I.V.:711.6; IV Piggyback:1100.5] Out: -  Intake/Output this shift: No intake/output data recorded.  General appearance: alert and cooperative Resp: clear to auscultation bilaterally Cardio: regular rate and rhythm GI: soft, mild tenderness  Lab Results:  Recent Labs    08/29/21 1124 08/30/21 0352  WBC 14.4* 13.7*  HGB 14.3 12.6  HCT 42.4 37.3  PLT 252 220   BMET Recent Labs    08/29/21 1124 08/30/21 0352  NA 138 141  K 3.9 4.0  CL 102 106  CO2 27 29  GLUCOSE 133* 111*  BUN 15 17  CREATININE 0.86 0.85  CALCIUM 8.9 8.4*   PT/INR Recent Labs    08/30/21 0352  LABPROT 15.0  INR 1.2   ABG No results for input(s): PHART, HCO3 in the last 72 hours.  Invalid input(s): PCO2, PO2  Studies/Results: CT ABDOMEN PELVIS W CONTRAST  Result Date: 08/29/2021 CLINICAL DATA:  Left lower quadrant abdomen pain assess for diverticulitis. EXAM: CT ABDOMEN AND PELVIS WITH CONTRAST TECHNIQUE: Multidetector CT imaging of the abdomen and pelvis was performed using the standard protocol following bolus administration of intravenous contrast. CONTRAST:  119mL OMNIPAQUE IOHEXOL 300 MG/ML  SOLN COMPARISON:  None. FINDINGS: Lower chest: No acute abnormality. Hepatobiliary: No focal liver abnormality is seen. No gallstones, gallbladder wall thickening, or biliary dilatation. Pancreas: Unremarkable. No pancreatic ductal dilatation or surrounding inflammatory changes. Spleen: Normal in size without focal abnormality.  Adrenals/Urinary Tract: Adrenal glands are unremarkable. Kidneys are normal, without renal calculi, focal lesion, or hydronephrosis. Bladder is unremarkable. Left parapelvic renal cysts are identified. Stomach/Bowel: There is stranding inflammation surrounding the sigmoid colon with a few small foci of air adjacent to the area inflammation suggesting small focal micro perforation. Free fluid is identified in the pelvis. There is mild dilatation of the small bowel loops in the pelvis likely secondary to focal ileus. The small bowel loops are otherwise normal. The stomach is normal. The appendix is normal. Vascular/Lymphatic: Aortic atherosclerosis. No enlarged abdominal or pelvic lymph nodes. Reproductive: Uterine fibroids are noted. No definite adnexal masses are noted. Other: Free fluid in the pelvis as previously described. Musculoskeletal: Degenerative joint changes of the lower lumbar spine are identified. IMPRESSION: 1. Findings consistent with sigmoid diverticulitis with a few small foci of air adjacent to the area inflammation suggesting small focal micro perforation. Free fluid is identified in the pelvis. 2. Mild dilatation of the small bowel loops in the pelvis likely secondary to focal ileus. 3. Aortic atherosclerosis. Aortic Atherosclerosis (ICD10-I70.0). These results were called by telephone at the time of interpretation on 08/29/2021 at 12:57 pm to provider MADISON [facility] , who verbally acknowledged these results. Electronically Signed   By: Abelardo Diesel M.D.   On: 08/29/2021 12:57    Anti-infectives: Anti-infectives (From admission, onward)    Start     Dose/Rate Route Frequency Ordered Stop   08/29/21 2200  piperacillin-tazobactam (ZOSYN) IVPB 3.375 g  Status:  Discontinued        3.375 g 100 mL/hr over 30  Minutes Intravenous Every 8 hours 08/29/21 1831 08/29/21 1833   08/29/21 2200  piperacillin-tazobactam (ZOSYN) IVPB 3.375 g        3.375 g 12.5 mL/hr over 240 Minutes Intravenous Every  8 hours 08/29/21 1833     08/29/21 1315  piperacillin-tazobactam (ZOSYN) IVPB 3.375 g        3.375 g 100 mL/hr over 30 Minutes Intravenous  Once 08/29/21 1307 08/29/21 1359       Assessment/Plan: s/p * No surgery found * Continue bowel rest for now Continue IV zosyn Sigmoid diverticulitis with micro perf. Seems to be tolerating well. No sign of systemic sepsis  LOS: 1 day    Jaclyn York 08/30/2021

## 2021-08-30 NOTE — Progress Notes (Signed)
PROGRESS NOTE    Jaclyn York  XNA:[id number] DOB: [birth year] DOA: 08/29/2021 PCP: Wayland Salinas, MD    Chief Complaint  Patient presents with   Abdominal Pain    Nausea,     Brief Narrative:  Patient is a pleasant 65 year old female history of diverticulosis presenting to the ED with a 1 to 2-week history of worsening cramping lower abdominal pain.  Patient noted to have episode of nausea and 2 episodes of bilious emesis on the morning of admission.  CT abdomen and pelvis done concerning for sigmoid diverticulitis with microperforation, ileus.  Patient admitted, placed on bowel rest, empiric IV antibiotics, IV pain medication, IV antiemetics.  Supportive care.  General surgery consulted and following.   Assessment & Plan:   Principal Problem:   Sigmoid diverticulitis Active Problems:   Ileus (Normal)   Dehydration  1 acute sigmoid diverticulitis with microperforation -Patient with history of diverticulosis (noted on colonoscopy 3 to 4 years ago per patient), no prior history of acute diverticulitis. -Patient still with abdominal pain and per RN 2 mg of morphine not helping with pain. -Patient given IV Dilaudid yesterday however states it made her very hot and would prefer to morphine. -Improving slowly clinically. -Abdomen soft, some tenderness to palpation. -Continue bowel rest, IV fluids, IV antiemetics, IV Zosyn, pain management. -General surgery following and appreciate input and recommendations.  2.  Ileus -Noted on CT abdomen and pelvis. -Patient passing flatus.  No bowel movement. -Bowel rest. -Try to minimize narcotics. -Mobilize. -Stool softener.  3.  Dehydration IV fluids.   DVT prophylaxis: SCDs Code Status: Full Family Communication: Updated patient.  No family at bedside. Disposition:   Status is: Inpatient  Remains inpatient appropriate because: Severity of illness.       Consultants:  General surgery: Dr. Ninfa Linden  08/29/2021  Procedures:  CT abdomen and pelvis 08/29/2021  Antimicrobials:  IV Zosyn 08/29/2021>>>>>>   Subjective: Writhling in bed. C/o abd pain with no significant change from admission. Per RN 2mg  morphine not helping.  Patient noted to have received 4 mg of IV morphine.  Denies any nausea or emesis.  Passing flatus.  No bowel movement.  Objective: Vitals:   08/29/21 1656 08/29/21 2118 08/30/21 0117 08/30/21 0543  BP: 103/61 (!) 109/59 (!) 102/54 (!) 104/56  Pulse: 78 66 65 66  Resp: 18 20 18 18   Temp: 98.5 F (36.9 C) 97.9 F (36.6 C) 98.3 F (36.8 C) 98.3 F (36.8 C)  TempSrc: Oral Oral Oral Oral  SpO2: 90% 95% 98% 94%  Weight:      Height:        Intake/Output Summary (Last 24 hours) at 08/30/2021 1118 Last data filed at 08/30/2021 0540 Gross per 24 hour  Intake 1842.12 ml  Output --  Net 1842.12 ml   Filed Weights   08/29/21 1107  Weight: 76.7 kg    Examination:  General exam: NAD. Respiratory system: Clear to auscultation. Respiratory effort normal. Cardiovascular system: S1 & S2 heard, RRR. No JVD, murmurs, rubs, gallops or clicks. No pedal edema. Gastrointestinal system: Abdomen is nondistended, soft and some tenderness to palpation lower abdominal region.  Positive bowel sounds.  No rebound.  No guarding.   Central nervous system: Alert and oriented. No focal neurological deficits. Extremities: Symmetric 5 x 5 power. Skin: No rashes, lesions or ulcers Psychiatry: Judgement and insight appear normal. Mood & affect appropriate.     Data Reviewed: I have personally reviewed following labs and imaging studies  CBC: Recent Labs  Lab 08/29/21 1124 08/30/21 0352  WBC 14.4* 13.7*  NEUTROABS 13.1* 11.7*  HGB 14.3 12.6  HCT 42.4 37.3  MCV 93.8 94.7  PLT 252 353    Basic Metabolic Panel: Recent Labs  Lab 08/29/21 1124 08/30/21 0352  NA 138 141  K 3.9 4.0  CL 102 106  CO2 27 29  GLUCOSE 133* 111*  BUN 15 17  CREATININE 0.86 0.85   CALCIUM 8.9 8.4*  MG  --  2.1    GFR: Estimated Creatinine Clearance: 73.8 mL/min (by C-G formula based on SCr of 0.85 mg/dL).  Liver Function Tests: Recent Labs  Lab 08/29/21 1124 08/30/21 0352  AST 17 13*  ALT 16 14  ALKPHOS 49 39  BILITOT 0.6 0.9  PROT 7.4 6.3*  ALBUMIN 3.9 3.2*    CBG: No results for input(s): GLUCAP in the last 168 hours.   Recent Results (from the past 240 hour(s))  Resp Panel by RT-PCR (Flu A&B, Covid) Nasopharyngeal Swab     Status: None   Collection Time: 08/29/21 11:24 AM   Specimen: Nasopharyngeal Swab; Nasopharyngeal(NP) swabs in vial transport medium  Result Value Ref Range Status   SARS Coronavirus 2 by RT PCR NEGATIVE NEGATIVE Final    Comment: (NOTE) SARS-CoV-2 target nucleic acids are NOT DETECTED.  The SARS-CoV-2 RNA is generally detectable in upper respiratory specimens during the acute phase of infection. The lowest concentration of SARS-CoV-2 viral copies this assay can detect is 138 copies/mL. A negative result does not preclude SARS-Cov-2 infection and should not be used as the sole basis for treatment or other patient management decisions. A negative result may occur with  improper specimen collection/handling, submission of specimen other than nasopharyngeal swab, presence of viral mutation(s) within the areas targeted by this assay, and inadequate number of viral copies(<138 copies/mL). A negative result must be combined with clinical observations, patient history, and epidemiological information. The expected result is Negative.  Fact Sheet for Patients:  EntrepreneurPulse.com.au  Fact Sheet for Healthcare Providers:  IncredibleEmployment.be  This test is no t yet approved or cleared by the [place] FDA and  has been authorized for detection and/or diagnosis of SARS-CoV-2 by FDA under an Emergency Use Authorization (EUA). This EUA will remain  in effect (meaning this test can  be used) for the duration of the COVID-19 declaration under Section 564(b)(1) of the Act, 21 U.S.C.section 360bbb-3(b)(1), unless the authorization is terminated  or revoked sooner.       Influenza A by PCR NEGATIVE NEGATIVE Final   Influenza B by PCR NEGATIVE NEGATIVE Final    Comment: (NOTE) The Xpert Xpress SARS-CoV-2/FLU/RSV plus assay is intended as an aid in the diagnosis of influenza from Nasopharyngeal swab specimens and should not be used as a sole basis for treatment. Nasal washings and aspirates are unacceptable for Xpert Xpress SARS-CoV-2/FLU/RSV testing.  Fact Sheet for Patients: EntrepreneurPulse.com.au  Fact Sheet for Healthcare Providers: IncredibleEmployment.be  This test is not yet approved or cleared by the [place] FDA and has been authorized for detection and/or diagnosis of SARS-CoV-2 by FDA under an Emergency Use Authorization (EUA). This EUA will remain in effect (meaning this test can be used) for the duration of the COVID-19 declaration under Section 564(b)(1) of the Act, 21 U.S.C. section 360bbb-3(b)(1), unless the authorization is terminated or revoked.  Performed at [facility], [address], [place], [place] [address]          Radiology Studies: CT ABDOMEN PELVIS W CONTRAST  Result Date: 08/29/2021 CLINICAL DATA:  Left lower quadrant abdomen pain assess for diverticulitis. EXAM: CT ABDOMEN AND PELVIS WITH CONTRAST TECHNIQUE: Multidetector CT imaging of the abdomen and pelvis was performed using the standard protocol following bolus administration of intravenous contrast. CONTRAST:  188mL OMNIPAQUE IOHEXOL 300 MG/ML  SOLN COMPARISON:  None. FINDINGS: Lower chest: No acute abnormality. Hepatobiliary: No focal liver abnormality is seen. No gallstones, gallbladder wall thickening, or biliary dilatation. Pancreas: Unremarkable. No pancreatic ductal dilatation or surrounding inflammatory changes.  Spleen: Normal in size without focal abnormality. Adrenals/Urinary Tract: Adrenal glands are unremarkable. Kidneys are normal, without renal calculi, focal lesion, or hydronephrosis. Bladder is unremarkable. Left parapelvic renal cysts are identified. Stomach/Bowel: There is stranding inflammation surrounding the sigmoid colon with a few small foci of air adjacent to the area inflammation suggesting small focal micro perforation. Free fluid is identified in the pelvis. There is mild dilatation of the small bowel loops in the pelvis likely secondary to focal ileus. The small bowel loops are otherwise normal. The stomach is normal. The appendix is normal. Vascular/Lymphatic: Aortic atherosclerosis. No enlarged abdominal or pelvic lymph nodes. Reproductive: Uterine fibroids are noted. No definite adnexal masses are noted. Other: Free fluid in the pelvis as previously described. Musculoskeletal: Degenerative joint changes of the lower lumbar spine are identified. IMPRESSION: 1. Findings consistent with sigmoid diverticulitis with a few small foci of air adjacent to the area inflammation suggesting small focal micro perforation. Free fluid is identified in the pelvis. 2. Mild dilatation of the small bowel loops in the pelvis likely secondary to focal ileus. 3. Aortic atherosclerosis. Aortic Atherosclerosis (ICD10-I70.0). These results were called by telephone at the time of interpretation on 08/29/2021 at 12:57 pm to provider MADISON [facility] , who verbally acknowledged these results. Electronically Signed   By: Abelardo Diesel M.D.   On: 08/29/2021 12:57        Scheduled Meds:  senna-docusate  1 tablet Oral BID   sodium chloride flush  3 mL Intravenous Q12H   Continuous Infusions:  sodium chloride 125 mL/hr at 08/29/21 1954   piperacillin-tazobactam (ZOSYN)  IV 3.375 g (08/30/21 0539)     LOS: 1 day    Time spent: 40 mins    Irine Seal, MD [facility]   To contact the attending provider  between 7A-7P or the covering provider during after hours 7P-7A, please log into the web site [email or web address] and access using universal [place] password for that web site. If you do not have the password, please call the hospital operator.  08/30/2021, 11:18 AM

## 2021-08-31 LAB — CBC WITH DIFFERENTIAL/PLATELET
Abs Immature Granulocytes: 0.08 10*3/uL — ABNORMAL HIGH (ref 0.00–0.07)
Basophils Absolute: 0 10*3/uL (ref 0.0–0.1)
Basophils Relative: 0 %
Eosinophils Absolute: 0 10*3/uL (ref 0.0–0.5)
Eosinophils Relative: 0 %
HCT: 34.3 % — ABNORMAL LOW (ref 36.0–46.0)
Hemoglobin: 11.3 g/dL — ABNORMAL LOW (ref 12.0–15.0)
Immature Granulocytes: 1 %
Lymphocytes Relative: 11 %
Lymphs Abs: 1.4 10*3/uL (ref 0.7–4.0)
MCH: 31.9 pg (ref 26.0–34.0)
MCHC: 32.9 g/dL (ref 30.0–36.0)
MCV: 96.9 fL (ref 80.0–100.0)
Monocytes Absolute: 0.7 10*3/uL (ref 0.1–1.0)
Monocytes Relative: 5 %
Neutro Abs: 10.9 10*3/uL — ABNORMAL HIGH (ref 1.7–7.7)
Neutrophils Relative %: 83 %
Platelets: 195 10*3/uL (ref 150–400)
RBC: 3.54 MIL/uL — ABNORMAL LOW (ref 3.87–5.11)
RDW: 12.2 % (ref 11.5–15.5)
WBC: 13.1 10*3/uL — ABNORMAL HIGH (ref 4.0–10.5)
nRBC: 0 % (ref 0.0–0.2)

## 2021-08-31 LAB — BASIC METABOLIC PANEL
Anion gap: 7 (ref 5–15)
BUN: 18 mg/dL (ref 8–23)
CO2: 26 mmol/L (ref 22–32)
Calcium: 7.9 mg/dL — ABNORMAL LOW (ref 8.9–10.3)
Chloride: 105 mmol/L (ref 98–111)
Creatinine, Ser: 0.9 mg/dL (ref 0.44–1.00)
GFR, Estimated: >60 mL/min (ref >60)
Glucose, Bld: 75 mg/dL (ref 70–99)
Potassium: 3.9 mmol/L (ref 3.5–5.1)
Sodium: 138 mmol/L (ref 135–145)

## 2021-08-31 MED ORDER — OXYCODONE HCL 5 MG PO TABS: 5.0000 mg | ORAL_TABLET | ORAL | Status: DC | PRN

## 2021-08-31 NOTE — Progress Notes (Signed)
Subjective: CC: Patient reports 2 weeks of abdominal pain prior to admission.  She reports that yesterday she had nausea as well as persistent moderate/severe left lower quadrant abdominal pain.  She reports this is much improved today and denies any nausea or vomiting. Pain is worse with movement and palpation. She reports she is passing flatus and had a small BM that was liquidy this a.m.  Objective: Vital signs in last 24 hours: Temp:  [98.4 F (36.9 C)-100.2 F (37.9 C)] 100.2 F (37.9 C) (11/14 0450) Pulse Rate:  [67-82] 82 (11/14 0450) Resp:  [18-20] 20 (11/14 0450) BP: (104-119)/(53-59) 119/59 (11/14 0450) SpO2:  [89 %-97 %] 89 % (11/14 0450) Weight:  [74.2 kg] 74.2 kg (11/14 0450) Last BM Date: 08/28/21  Intake/Output from previous day: 11/13 0701 - 11/14 0700 In: 616.4 [I.V.:566.4; IV Piggyback:50] Out: -  Intake/Output this shift: No intake/output data recorded.  PE: Gen:  Alert, NAD, pleasant HEENT: EOM's intact, pupils equal and round Card:  Reg Pulm:  Normal rate and effort Abd: Soft, mild distension, LLQ>suprapubic tenderness without rigidity or guarding. Slightly hypoactive bowel sounds Ext:  No LE edema Psych: A&Ox3  Skin: no rashes noted, warm and dry  Lab Results:  Recent Labs    08/30/21 0352 08/31/21 0341  WBC 13.7* 13.1*  HGB 12.6 11.3*  HCT 37.3 34.3*  PLT 220 195   BMET Recent Labs    08/30/21 0352 08/31/21 0341  NA 141 138  K 4.0 3.9  CL 106 105  CO2 29 26  GLUCOSE 111* 75  BUN 17 18  CREATININE 0.85 0.90  CALCIUM 8.4* 7.9*   PT/INR Recent Labs    08/30/21 0352  LABPROT 15.0  INR 1.2   CMP     Component Value Date/Time   NA 138 08/31/2021 0341   K 3.9 08/31/2021 0341   CL 105 08/31/2021 0341   CO2 26 08/31/2021 0341   GLUCOSE 75 08/31/2021 0341   BUN 18 08/31/2021 0341   CREATININE 0.90 08/31/2021 0341   CALCIUM 7.9 (L) 08/31/2021 0341   PROT 6.3 (L) 08/30/2021 0352   ALBUMIN 3.2 (L) 08/30/2021 0352   AST  13 (L) 08/30/2021 0352   ALT 14 08/30/2021 0352   ALKPHOS 39 08/30/2021 0352   BILITOT 0.9 08/30/2021 0352   GFRNONAA >60 08/31/2021 0341   Lipase     Component Value Date/Time   LIPASE 25 08/29/2021 1124    Studies/Results: CT ABDOMEN PELVIS W CONTRAST  Result Date: 08/29/2021 CLINICAL DATA:  Left lower quadrant abdomen pain assess for diverticulitis. EXAM: CT ABDOMEN AND PELVIS WITH CONTRAST TECHNIQUE: Multidetector CT imaging of the abdomen and pelvis was performed using the standard protocol following bolus administration of intravenous contrast. CONTRAST:  1110mL OMNIPAQUE IOHEXOL 300 MG/ML  SOLN COMPARISON:  None. FINDINGS: Lower chest: No acute abnormality. Hepatobiliary: No focal liver abnormality is seen. No gallstones, gallbladder wall thickening, or biliary dilatation. Pancreas: Unremarkable. No pancreatic ductal dilatation or surrounding inflammatory changes. Spleen: Normal in size without focal abnormality. Adrenals/Urinary Tract: Adrenal glands are unremarkable. Kidneys are normal, without renal calculi, focal lesion, or hydronephrosis. Bladder is unremarkable. Left parapelvic renal cysts are identified. Stomach/Bowel: There is stranding inflammation surrounding the sigmoid colon with a few small foci of air adjacent to the area inflammation suggesting small focal micro perforation. Free fluid is identified in the pelvis. There is mild dilatation of the small bowel loops in the pelvis likely secondary to focal ileus. The small  bowel loops are otherwise normal. The stomach is normal. The appendix is normal. Vascular/Lymphatic: Aortic atherosclerosis. No enlarged abdominal or pelvic lymph nodes. Reproductive: Uterine fibroids are noted. No definite adnexal masses are noted. Other: Free fluid in the pelvis as previously described. Musculoskeletal: Degenerative joint changes of the lower lumbar spine are identified. IMPRESSION: 1. Findings consistent with sigmoid diverticulitis with a few  small foci of air adjacent to the area inflammation suggesting small focal micro perforation. Free fluid is identified in the pelvis. 2. Mild dilatation of the small bowel loops in the pelvis likely secondary to focal ileus. 3. Aortic atherosclerosis. Aortic Atherosclerosis (ICD10-I70.0). These results were called by telephone at the time of interpretation on 08/29/2021 at 12:57 pm to provider MADISON [facility] , who verbally acknowledged these results. Electronically Signed   By: Abelardo Diesel M.D.   On: 08/29/2021 12:57    Anti-infectives: Anti-infectives (From admission, onward)    Start     Dose/Rate Route Frequency Ordered Stop   08/29/21 2200  piperacillin-tazobactam (ZOSYN) IVPB 3.375 g  Status:  Discontinued        3.375 g 100 mL/hr over 30 Minutes Intravenous Every 8 hours 08/29/21 1831 08/29/21 1833   08/29/21 2200  piperacillin-tazobactam (ZOSYN) IVPB 3.375 g        3.375 g 12.5 mL/hr over 240 Minutes Intravenous Every 8 hours 08/29/21 1833     08/29/21 1315  piperacillin-tazobactam (ZOSYN) IVPB 3.375 g        3.375 g 100 mL/hr over 30 Minutes Intravenous  Once 08/29/21 1307 08/29/21 1359        Assessment/Plan Sigmoid diverticulitis with microperforation - Pain improved. WBC downtrending (14.4 > 13.7 > 13.1) - Cont IV abx  - Start CLD - Follow exam, WBC and fever curve.  Discussed with patient that hopefully she will improve with conservative therapies.  We discussed the possibility of repeat CAT scan to determine if she was developing a intra-abdominal abscess that may require percutaneous drainage if she is not improving by HD 4-5. We also discussed if she was to fail to improve with conservative therapy she may require surgery that would likely result in a colectomy and colostomy. Patient states understanding of this. - This is the patient's first episode of diverticulitis. She reports she gets colonoscopies every 3 to 5 years for polyps in her colon.  She reports that is  probably been at least 3 years since her last endoscopy which is usually done in [place].  FEN - CLD, IVF VTE - SCDs, okay for chemical prophylaxis from a general surgery standpoint ID - Zosyn   LOS: 2 days    Jillyn Ledger , [facility] 08/31/2021, 10:07 AM Please see Amion for pager number during day hours 7:00am-4:30pm

## 2021-08-31 NOTE — Progress Notes (Signed)
PROGRESS NOTE    Jaclyn York  JJK:[id number] DOB: 17-Oct-1956 DOA: 08/29/2021 PCP: Wayland Salinas, MD    Chief Complaint  Patient presents with   Abdominal Pain    Nausea,     Brief Narrative:  Patient is a pleasant 65 year old female history of diverticulosis presenting to the ED with a 1 to 2-week history of worsening cramping lower abdominal pain.  Patient noted to have episode of nausea and 2 episodes of bilious emesis on the morning of admission.  CT abdomen and pelvis done concerning for sigmoid diverticulitis with microperforation, ileus.  Patient admitted, placed on bowel rest, empiric IV antibiotics, IV pain medication, IV antiemetics.  Supportive care.  General surgery consulted and following.   Assessment & Plan:   Principal Problem:   Sigmoid diverticulitis Active Problems:   Ileus ([place])   Dehydration  1 acute sigmoid diverticulitis with microperforation -Patient with history of diverticulosis (noted on colonoscopy 3 to 4 years ago per patient), no prior history of acute diverticulitis. -Patient improving clinically with some abdominal discomfort.   -IV pain medication changed from IV Dilaudid to IV morphine as needed.  -States had a smear for bowel movement.   -Patient started on clears per general surgery.   -Continue IV fluids, IV antiemetics, IV Zosyn, pain management  -General surgery following and appreciate input and recommendations.    2.  Ileus -Noted on CT abdomen and pelvis. -Patient passing flatus.  States had a smear for bowel movement this morning -Started on clears per general surgery. -Try to minimize narcotics. -Mobilize. -Stool softener.  3.  Dehydration IV fluids. -If tolerating clears will likely discontinue IV fluids tomorrow.   DVT prophylaxis: SCDs Code Status: Full Family Communication: Updated patient.  No family at bedside. Disposition:   Status is: Inpatient  Remains inpatient appropriate because: Severity of  illness.       Consultants:  General surgery: Dr. Ninfa Linden 08/29/2021  Procedures:  CT abdomen and pelvis 08/29/2021  Antimicrobials:  IV Zosyn 08/29/2021>>>>>>   Subjective: Sitting up in bed looks more comfortable today.  Tolerating clears.  No chest pain.  No shortness of breath.  Feels abdominal pain improving since admission.  No nausea or emesis.  Complain of some abdominal discomfort.   Objective: Vitals:   08/30/21 0543 08/30/21 1235 08/30/21 2055 08/31/21 0450  BP: (!) 104/56 (!) 104/53 (!) 109/58 (!) 119/59  Pulse: 66 67 76 82  Resp: 18 18 20 20   Temp: 98.3 F (36.8 C) 98.6 F (37 C) 98.4 F (36.9 C) 100.2 F (37.9 C)  TempSrc: Oral Axillary Oral Oral  SpO2: 94% 97% 95% (!) 89%  Weight:    74.2 kg  Height:        Intake/Output Summary (Last 24 hours) at 08/31/2021 1253 Last data filed at 08/30/2021 1430 Gross per 24 hour  Intake 616.37 ml  Output --  Net 616.37 ml    Filed Weights   08/29/21 1107 08/31/21 0450  Weight: 76.7 kg 74.2 kg    Examination:  General exam: : NAD Respiratory system: CTA B.  No wheezes, no rhonchi.  Speaking in full sentences.  Normal respiratory effort. Cardiovascular system: Regular rate and rhythm no murmurs rubs or gallops.  No JVD.  No lower extremity edema.  Gastrointestinal system: Abdomen soft, nondistended, some tenderness to palpation in the lower abdominal region.  Positive bowel sounds.  No rebound.  No guarding.  Central nervous system: Alert and oriented. No focal neurological deficits. Extremities: Symmetric 5 x 5  power. Skin: No rashes, lesions or ulcers Psychiatry: Judgement and insight appear normal. Mood & affect appropriate.   Data Reviewed: I have personally reviewed following labs and imaging studies  CBC: Recent Labs  Lab 08/29/21 1124 08/30/21 0352 08/31/21 0341  WBC 14.4* 13.7* 13.1*  NEUTROABS 13.1* 11.7* 10.9*  HGB 14.3 12.6 11.3*  HCT 42.4 37.3 34.3*  MCV 93.8 94.7 96.9  PLT 252 220  195     Basic Metabolic Panel: Recent Labs  Lab 08/29/21 1124 08/30/21 0352 08/31/21 0341  NA 138 141 138  K 3.9 4.0 3.9  CL 102 106 105  CO2 27 29 26   GLUCOSE 133* 111* 75  BUN 15 17 18   CREATININE 0.86 0.85 0.90  CALCIUM 8.9 8.4* 7.9*  MG  --  2.1  --      GFR: Estimated Creatinine Clearance: 64.5 mL/min (by C-G formula based on SCr of 0.9 mg/dL).  Liver Function Tests: Recent Labs  Lab 08/29/21 1124 08/30/21 0352  AST 17 13*  ALT 16 14  ALKPHOS 49 39  BILITOT 0.6 0.9  PROT 7.4 6.3*  ALBUMIN 3.9 3.2*     CBG: No results for input(s): GLUCAP in the last 168 hours.   Recent Results (from the past 240 hour(s))  Resp Panel by RT-PCR (Flu A&B, Covid) Nasopharyngeal Swab     Status: None   Collection Time: 08/29/21 11:24 AM   Specimen: Nasopharyngeal Swab; Nasopharyngeal(NP) swabs in vial transport medium  Result Value Ref Range Status   SARS Coronavirus 2 by RT PCR NEGATIVE NEGATIVE Final    Comment: (NOTE) SARS-CoV-2 target nucleic acids are NOT DETECTED.  The SARS-CoV-2 RNA is generally detectable in upper respiratory specimens during the acute phase of infection. The lowest concentration of SARS-CoV-2 viral copies this assay can detect is 138 copies/mL. A negative result does not preclude SARS-Cov-2 infection and should not be used as the sole basis for treatment or other patient management decisions. A negative result may occur with  improper specimen collection/handling, submission of specimen other than nasopharyngeal swab, presence of viral mutation(s) within the areas targeted by this assay, and inadequate number of viral copies(<138 copies/mL). A negative result must be combined with clinical observations, patient history, and epidemiological information. The expected result is Negative.  Fact Sheet for Patients:  EntrepreneurPulse.com.au  Fact Sheet for Healthcare Providers:  IncredibleEmployment.be  This  test is no t yet approved or cleared by the [place] FDA and  has been authorized for detection and/or diagnosis of SARS-CoV-2 by FDA under an Emergency Use Authorization (EUA). This EUA will remain  in effect (meaning this test can be used) for the duration of the COVID-19 declaration under Section 564(b)(1) of the Act, 21 U.S.C.section 360bbb-3(b)(1), unless the authorization is terminated  or revoked sooner.       Influenza A by PCR NEGATIVE NEGATIVE Final   Influenza B by PCR NEGATIVE NEGATIVE Final    Comment: (NOTE) The Xpert Xpress SARS-CoV-2/FLU/RSV plus assay is intended as an aid in the diagnosis of influenza from Nasopharyngeal swab specimens and should not be used as a sole basis for treatment. Nasal washings and aspirates are unacceptable for Xpert Xpress SARS-CoV-2/FLU/RSV testing.  Fact Sheet for Patients: EntrepreneurPulse.com.au  Fact Sheet for Healthcare Providers: IncredibleEmployment.be  This test is not yet approved or cleared by the [place] FDA and has been authorized for detection and/or diagnosis of SARS-CoV-2 by FDA under an Emergency Use Authorization (EUA). This EUA will remain in effect (meaning this  test can be used) for the duration of the COVID-19 declaration under Section 564(b)(1) of the Act, 21 U.S.C. section 360bbb-3(b)(1), unless the authorization is terminated or revoked.  Performed at [facility], [address] [place], [place] [address]           Radiology Studies: No results found.      Scheduled Meds:  senna-docusate  1 tablet Oral BID   sodium chloride flush  3 mL Intravenous Q12H   Continuous Infusions:  sodium chloride 125 mL/hr at 08/31/21 0110   piperacillin-tazobactam (ZOSYN)  IV 3.375 g (08/31/21 0555)     LOS: 2 days    Time spent: 40 mins    Irine Seal, MD [facility]   To contact the attending provider between 7A-7P or the  covering provider during after hours 7P-7A, please log into the web site [email or web address] and access using universal [place] password for that web site. If you do not have the password, please call the hospital operator.  08/31/2021, 12:53 PM

## 2021-08-31 NOTE — TOC Initial Note (Signed)
Transition of Care [facility]) - Initial/Assessment Note    Patient Details  Name: Jaclyn York MRN: [id number] Date of Birth: 02/08/1956  [facility] CM/SW Contact:    Leeroy Cha, RN Phone Number: 08/31/2021, 10:35 AM  Clinical Narrative:                  65 year old female history of diverticulosis presenting to the ED with a 1 to 2-week history of worsening cramping lower abdominal pain.  Patient noted to have episode of nausea and 2 episodes of bilious emesis on the morning of admission.  CT abdomen and pelvis done concerning for sigmoid diverticulitis with microperforation, ileus.  Patient admitted, placed on bowel rest, empiric IV antibiotics, IV pain medication, IV antiemetics.  Supportive care.  General surgery consulted and following.     Assessment & Plan:   Principal Problem:   Sigmoid diverticulitis Active Problems:   Ileus ([place])   Dehydration   1 acute sigmoid diverticulitis with microperforation -Patient with history of diverticulosis (noted on colonoscopy 3 to 4 years ago per patient), no prior history of acute diverticulitis. -Patient still with abdominal pain and per RN 2 mg of morphine not helping with pain. -Patient given IV Dilaudid yesterday however states it made her very hot and would prefer to morphine. -Improving slowly clinically. -Abdomen soft, some tenderness to palpation. -Continue bowel rest, IV fluids, IV antiemetics, IV Zosyn, pain management. -General surgery following and appreciate input and recommendations.  2.  Ileus -Noted on CT abdomen and pelvis. -Patient passing flatus.  No bowel movement. -Bowel rest. -Try to minimize narcotics. -Mobilize. -Stool softener.  Toc plan of care: Will follow for outcome of ileus, and hhc needs  Expected Discharge Plan: Home/Self Care Barriers to Discharge: Continued Medical Work up   Patient Goals and CMS Choice Patient states their goals for this hospitalization and ongoing  recovery are:: to go home CMS Medicare.gov Compare Post Acute Care list provided to:: Patient Choice offered to / list presented to : Patient  Expected Discharge Plan and Services Expected Discharge Plan: Home/Self Care   Discharge Planning Services: CM Consult   Living arrangements for the past 2 months: Single Family Home                                      Prior Living Arrangements/Services Living arrangements for the past 2 months: Single Family Home Lives with:: Spouse Patient language and need for interpreter reviewed:: Yes Do you feel safe going back to the place where you live?: Yes            Criminal Activity/Legal Involvement Pertinent to Current Situation/Hospitalization: No - Comment as needed  Activities of Daily Living Home Assistive Devices/Equipment: None ADL Screening (condition at time of admission) Patient's cognitive ability adequate to safely complete daily activities?: Yes Is the patient deaf or have difficulty hearing?: No Does the patient have difficulty seeing, even when wearing glasses/contacts?: No Does the patient have difficulty concentrating, remembering, or making decisions?: No Patient able to express need for assistance with ADLs?: Yes Does the patient have difficulty dressing or bathing?: No Independently performs ADLs?: Yes (appropriate for developmental age) Does the patient have difficulty walking or climbing stairs?: No Weakness of Legs: None Weakness of Arms/Hands: None  Permission Sought/Granted                  Emotional Assessment Appearance:: Appears stated  age Attitude/Demeanor/Rapport: Engaged Affect (typically observed): Calm Orientation: : Oriented to Place, Oriented to  Time, Oriented to Self, Oriented to Situation Alcohol / Substance Use: Not Applicable Psych Involvement: No (comment)  Admission diagnosis:  Diverticulitis [K57.92] Sigmoid diverticulitis [K57.32] Patient Active Problem List   Diagnosis  Date Noted   Sigmoid diverticulitis 08/29/2021   Ileus ([place]) 08/29/2021   Dehydration 08/29/2021   PCP:  Wayland Salinas, MD Pharmacy:   [id number] [place], [address] [place] [address] Phone: [phone] Fax: [phone]     Social Determinants of Health (SDOH) Interventions    Readmission Risk Interventions No flowsheet data found.

## 2021-09-01 LAB — CBC
HCT: 34.2 % — ABNORMAL LOW (ref 36.0–46.0)
Hemoglobin: 11.4 g/dL — ABNORMAL LOW (ref 12.0–15.0)
MCH: 31.8 pg (ref 26.0–34.0)
MCHC: 33.3 g/dL (ref 30.0–36.0)
MCV: 95.3 fL (ref 80.0–100.0)
Platelets: 198 10*3/uL (ref 150–400)
RBC: 3.59 MIL/uL — ABNORMAL LOW (ref 3.87–5.11)
RDW: 12.1 % (ref 11.5–15.5)
WBC: 12.2 10*3/uL — ABNORMAL HIGH (ref 4.0–10.5)
nRBC: 0 % (ref 0.0–0.2)

## 2021-09-01 LAB — MAGNESIUM: Magnesium: 2 mg/dL (ref 1.7–2.4)

## 2021-09-01 LAB — BASIC METABOLIC PANEL
Anion gap: 8 (ref 5–15)
BUN: 10 mg/dL (ref 8–23)
CO2: 23 mmol/L (ref 22–32)
Calcium: 7.8 mg/dL — ABNORMAL LOW (ref 8.9–10.3)
Chloride: 107 mmol/L (ref 98–111)
Creatinine, Ser: 0.78 mg/dL (ref 0.44–1.00)
GFR, Estimated: >60 mL/min (ref >60)
Glucose, Bld: 81 mg/dL (ref 70–99)
Potassium: 3.8 mmol/L (ref 3.5–5.1)
Sodium: 138 mmol/L (ref 135–145)

## 2021-09-01 MED ORDER — TRAMADOL HCL 50 MG PO TABS
50.0000 mg | ORAL_TABLET | Freq: Four times a day (QID) | ORAL | Status: DC | PRN
Start: 2021-09-01 — End: 2021-09-07
  Administered 2021-09-01 – 2021-09-03 (×5): 50 mg via ORAL
  Administered 2021-09-04 (×2): 100 mg via ORAL
  Administered 2021-09-04 – 2021-09-05 (×2): 50 mg via ORAL
  Administered 2021-09-06: 100 mg via ORAL
  Administered 2021-09-06: 50 mg via ORAL
  Filled 2021-09-01: qty 1
  Filled 2021-09-01 (×3): qty 2
  Filled 2021-09-01 (×3): qty 1
  Filled 2021-09-01: qty 2
  Filled 2021-09-01 (×4): qty 1

## 2021-09-01 MED ORDER — MORPHINE SULFATE (PF) 2 MG/ML IV SOLN
2.0000 mg | INTRAVENOUS | Status: DC | PRN
  Administered 2021-09-03: 11:00:00 2 mg via INTRAVENOUS
  Filled 2021-09-01: qty 1

## 2021-09-01 MED ORDER — TRAMADOL HCL 50 MG PO TABS
50.0000 mg | ORAL_TABLET | Freq: Four times a day (QID) | ORAL | Status: DC | PRN
  Administered 2021-09-01: 50 mg via ORAL
  Filled 2021-09-01: qty 1

## 2021-09-01 MED ORDER — ENOXAPARIN SODIUM 40 MG/0.4ML IJ SOSY
40.0000 mg | PREFILLED_SYRINGE | INTRAMUSCULAR | Status: DC
  Administered 2021-09-01 – 2021-09-06 (×5): 40 mg via SUBCUTANEOUS
  Filled 2021-09-01 (×5): qty 0.4

## 2021-09-01 NOTE — Progress Notes (Signed)
PROGRESS NOTE    Jaclyn York  FMB:[id number] DOB: Nov 17, 1955 DOA: 08/29/2021 PCP: Wayland Salinas, MD    Chief Complaint  Patient presents with   Abdominal Pain    Nausea,     Brief Narrative:  Patient is a pleasant 65 year old female history of diverticulosis presenting to the ED with a 1 to 2-week history of worsening cramping lower abdominal pain.  Patient noted to have episode of nausea and 2 episodes of bilious emesis on the morning of admission.  CT abdomen and pelvis done concerning for sigmoid diverticulitis with microperforation, ileus.  Patient admitted, placed on bowel rest, empiric IV antibiotics, IV pain medication, IV antiemetics.  Supportive care.  General surgery consulted and following.   Assessment & Plan:   Principal Problem:   Sigmoid diverticulitis Active Problems:   Ileus ([place])   Dehydration  1 acute sigmoid diverticulitis with microperforation -Patient with history of diverticulosis (noted on colonoscopy 3 to 4 years ago per patient), no prior history of acute diverticulitis. -Patient improving clinically with some abdominal discomfort.   -IV pain medication changed from IV Dilaudid to IV morphine as needed.  -Patient passing flatus.  Had bowel movement.   -States developed some nausea with oxycodone.   -Diet advanced to full liquid diet.   -Continue IV antiemetics, IV antibiotics with Zosyn, pain management.  -Discontinue oxycodone, trial of Ultram.   -Saline lock IV fluids.   -Mobilize.   -General surgery following and appreciate input and recommendations.    2.  Ileus -Noted on CT abdomen and pelvis. -Patient passing flatus.  Stated had a bowel movement.   -Tolerating clears.   -Diet advanced to full liquid diet per general surgery.   -Try to minimize narcotics.   -Mobilize.    3.  Dehydration -Tolerating clears.   -Improved with IV fluids.   -Saline lock IV fluids.     DVT prophylaxis: SCDs Code Status: Full Family  Communication: Updated patient.  No family at bedside. Disposition:   Status is: Inpatient  Remains inpatient appropriate because: Severity of illness.       Consultants:  General surgery: Dr. Ninfa Linden 08/29/2021  Procedures:  CT abdomen and pelvis 08/29/2021  Antimicrobials:  IV Zosyn 08/29/2021>>>>>>   Subjective: Laying in bed.  Overall feeling better.  Abdominal pain improving.  No nausea or vomiting.  Tolerated clear liquids.  Diet advanced to full liquids per general surgery.  Patient states oxycodone causes nausea.  States had a bowel movement.  Objective: Vitals:   08/31/21 2031 09/01/21 0500 09/01/21 0542 09/01/21 0759  BP: 131/68  128/68 133/64  Pulse: 83  77 73  Resp: 16  16 16   Temp: 99.7 F (37.6 C)  98.7 F (37.1 C) 98.1 F (36.7 C)  TempSrc: Oral   Oral  SpO2: 94%  96% 97%  Weight:  74.3 kg    Height:        Intake/Output Summary (Last 24 hours) at 09/01/2021 1315 Last data filed at 09/01/2021 1243 Gross per 24 hour  Intake 2071.99 ml  Output --  Net 2071.99 ml    Filed Weights   08/29/21 1107 08/31/21 0450 09/01/21 0500  Weight: 76.7 kg 74.2 kg 74.3 kg    Examination:  General exam: : NAD Respiratory system: CTAB.  No wheezes, no rhonchi.  Speaking in full sentences.  Normal respiratory effort. Cardiovascular system: Regular rate and rhythm no murmurs rubs or gallops.  No JVD.  No lower extremity edema.  Gastrointestinal system: Abdomen soft, nondistended, positive  bowel sounds, some tenderness to palpation in the lower abdominal region and upper abdominal region.  No rebound.  No guarding.  Central nervous system: Alert and oriented. No focal neurological deficits. Extremities: Symmetric 5 x 5 power. Skin: No rashes, lesions or ulcers Psychiatry: Judgement and insight appear normal. Mood & affect appropriate.  Data Reviewed: I have personally reviewed following labs and imaging studies  CBC: Recent Labs  Lab 08/29/21 1124  08/30/21 0352 08/31/21 0341 09/01/21 0340  WBC 14.4* 13.7* 13.1* 12.2*  NEUTROABS 13.1* 11.7* 10.9*  --   HGB 14.3 12.6 11.3* 11.4*  HCT 42.4 37.3 34.3* 34.2*  MCV 93.8 94.7 96.9 95.3  PLT 252 220 195 198     Basic Metabolic Panel: Recent Labs  Lab 08/29/21 1124 08/30/21 0352 08/31/21 0341 09/01/21 0340  NA 138 141 138 138  K 3.9 4.0 3.9 3.8  CL 102 106 105 107  CO2 27 29 26 23   GLUCOSE 133* 111* 75 81  BUN 15 17 18 10   CREATININE 0.86 0.85 0.90 0.78  CALCIUM 8.9 8.4* 7.9* 7.8*  MG  --  2.1  --  2.0     GFR: Estimated Creatinine Clearance: 72.6 mL/min (by C-G formula based on SCr of 0.78 mg/dL).  Liver Function Tests: Recent Labs  Lab 08/29/21 1124 08/30/21 0352  AST 17 13*  ALT 16 14  ALKPHOS 49 39  BILITOT 0.6 0.9  PROT 7.4 6.3*  ALBUMIN 3.9 3.2*     CBG: No results for input(s): GLUCAP in the last 168 hours.   Recent Results (from the past 240 hour(s))  Resp Panel by RT-PCR (Flu A&B, Covid) Nasopharyngeal Swab     Status: None   Collection Time: 08/29/21 11:24 AM   Specimen: Nasopharyngeal Swab; Nasopharyngeal(NP) swabs in vial transport medium  Result Value Ref Range Status   SARS Coronavirus 2 by RT PCR NEGATIVE NEGATIVE Final    Comment: (NOTE) SARS-CoV-2 target nucleic acids are NOT DETECTED.  The SARS-CoV-2 RNA is generally detectable in upper respiratory specimens during the acute phase of infection. The lowest concentration of SARS-CoV-2 viral copies this assay can detect is 138 copies/mL. A negative result does not preclude SARS-Cov-2 infection and should not be used as the sole basis for treatment or other patient management decisions. A negative result may occur with  improper specimen collection/handling, submission of specimen other than nasopharyngeal swab, presence of viral mutation(s) within the areas targeted by this assay, and inadequate number of viral copies(<138 copies/mL). A negative result must be combined with clinical  observations, patient history, and epidemiological information. The expected result is Negative.  Fact Sheet for Patients:  EntrepreneurPulse.com.au  Fact Sheet for Healthcare Providers:  IncredibleEmployment.be  This test is no t yet approved or cleared by the [place] FDA and  has been authorized for detection and/or diagnosis of SARS-CoV-2 by FDA under an Emergency Use Authorization (EUA). This EUA will remain  in effect (meaning this test can be used) for the duration of the COVID-19 declaration under Section 564(b)(1) of the Act, 21 U.S.C.section 360bbb-3(b)(1), unless the authorization is terminated  or revoked sooner.       Influenza A by PCR NEGATIVE NEGATIVE Final   Influenza B by PCR NEGATIVE NEGATIVE Final    Comment: (NOTE) The Xpert Xpress SARS-CoV-2/FLU/RSV plus assay is intended as an aid in the diagnosis of influenza from Nasopharyngeal swab specimens and should not be used as a sole basis for treatment. Nasal washings and aspirates are unacceptable for  Xpert Xpress SARS-CoV-2/FLU/RSV testing.  Fact Sheet for Patients: EntrepreneurPulse.com.au  Fact Sheet for Healthcare Providers: IncredibleEmployment.be  This test is not yet approved or cleared by the [place] FDA and has been authorized for detection and/or diagnosis of SARS-CoV-2 by FDA under an Emergency Use Authorization (EUA). This EUA will remain in effect (meaning this test can be used) for the duration of the COVID-19 declaration under Section 564(b)(1) of the Act, 21 U.S.C. section 360bbb-3(b)(1), unless the authorization is terminated or revoked.  Performed at [facility], [address], [place], [place] [address]           Radiology Studies: No results found.      Scheduled Meds:  enoxaparin (LOVENOX) injection  30 mg Subcutaneous Q24H   senna-docusate  1 tablet Oral BID   sodium  chloride flush  3 mL Intravenous Q12H   Continuous Infusions:  sodium chloride 125 mL/hr at 09/01/21 0923   piperacillin-tazobactam (ZOSYN)  IV 3.375 g (09/01/21 0555)     LOS: 3 days    Time spent: 35 mins    Irine Seal, MD [facility]   To contact the attending provider between 7A-7P or the covering provider during after hours 7P-7A, please log into the web site [email or web address] and access using universal [place] password for that web site. If you do not have the password, please call the hospital operator.  09/01/2021, 1:15 PM

## 2021-09-01 NOTE — Progress Notes (Signed)
Subjective: CC: Patient reports pain in her lower abdomen, greater on the left side that is improved from yesterday. She reports her pain was a 7/10 yesterday and was a 4-5/10 this am before receiving pain meds. Since receiving pain medications she has no pain unless she moves in bed. She reports she is passing flatus and had a normal sized, formed bm yesterday. Tolerating cld without n/v. Ambulating.   Objective: Vital signs in last 24 hours: Temp:  [97.6 F (36.4 C)-99.7 F (37.6 C)] 98.1 F (36.7 C) (11/15 0759) Pulse Rate:  [73-88] 73 (11/15 0759) Resp:  [16-18] 16 (11/15 0759) BP: (120-133)/(64-69) 133/64 (11/15 0759) SpO2:  [94 %-100 %] 97 % (11/15 0759) Weight:  [74.3 kg] 74.3 kg (11/15 0500) Last BM Date: 08/31/21  Intake/Output from previous day: 11/14 0701 - 11/15 0700 In: 675 [I.V.:625; IV Piggyback:50] Out: -  Intake/Output this shift: Total I/O In: 917 [I.V.:917] Out: -   PE: Gen:  Alert, NAD, pleasant Pulm:  Normal rate and effort Abd: Soft, mild distension, LLQ>suprapubic tenderness that is improved from yesterday and without rigidity or guarding. + bowel sounds Psych: A&Ox3  Skin: no rashes noted, warm and dry  Lab Results:  Recent Labs    08/31/21 0341 09/01/21 0340  WBC 13.1* 12.2*  HGB 11.3* 11.4*  HCT 34.3* 34.2*  PLT 195 198   BMET Recent Labs    08/31/21 0341 09/01/21 0340  NA 138 138  K 3.9 3.8  CL 105 107  CO2 26 23  GLUCOSE 75 81  BUN 18 10  CREATININE 0.90 0.78  CALCIUM 7.9* 7.8*   PT/INR Recent Labs    08/30/21 0352  LABPROT 15.0  INR 1.2   CMP     Component Value Date/Time   NA 138 09/01/2021 0340   K 3.8 09/01/2021 0340   CL 107 09/01/2021 0340   CO2 23 09/01/2021 0340   GLUCOSE 81 09/01/2021 0340   BUN 10 09/01/2021 0340   CREATININE 0.78 09/01/2021 0340   CALCIUM 7.8 (L) 09/01/2021 0340   PROT 6.3 (L) 08/30/2021 0352   ALBUMIN 3.2 (L) 08/30/2021 0352   AST 13 (L) 08/30/2021 0352   ALT 14 08/30/2021  0352   ALKPHOS 39 08/30/2021 0352   BILITOT 0.9 08/30/2021 0352   GFRNONAA >60 09/01/2021 0340   Lipase     Component Value Date/Time   LIPASE 25 08/29/2021 1124    Studies/Results: No results found.  Anti-infectives: Anti-infectives (From admission, onward)    Start     Dose/Rate Route Frequency Ordered Stop   08/29/21 2200  piperacillin-tazobactam (ZOSYN) IVPB 3.375 g  Status:  Discontinued        3.375 g 100 mL/hr over 30 Minutes Intravenous Every 8 hours 08/29/21 1831 08/29/21 1833   08/29/21 2200  piperacillin-tazobactam (ZOSYN) IVPB 3.375 g        3.375 g 12.5 mL/hr over 240 Minutes Intravenous Every 8 hours 08/29/21 1833     08/29/21 1315  piperacillin-tazobactam (ZOSYN) IVPB 3.375 g        3.375 g 100 mL/hr over 30 Minutes Intravenous  Once 08/29/21 1307 08/29/21 1359        Assessment/Plan Sigmoid diverticulitis with microperforation - Pain and exam improved. WBC downtrending (14.4 > 13.7 > 13.1>12.2) - Cont IV abx  - Adv to FLD - Follow exam, WBC and fever curve. Discussed with patient that hopefully she will improve with conservative therapies.  We discussed the possibility of  repeat CAT scan to determine if she was developing a intra-abdominal abscess that may require percutaneous drainage if she is not improving by HD 4-5. We also discussed if she was to fail to improve with conservative therapy she may require surgery that would likely result in a colectomy and colostomy. Patient states understanding of this. - This is the patient's first episode of diverticulitis. She reports she gets colonoscopies every 3 to 5 years for polyps in her colon.  She reports that is probably been at least 3 years since her last endoscopy which is usually done in [place].   FEN - FLD, IVF per TRH VTE - SCDs, okay for chemical prophylaxis from a general surgery standpoint ID - Zosyn   LOS: 3 days    Jillyn Ledger , [facility] Surgery 09/01/2021, 8:45  AM Please see Amion for pager number during day hours 7:00am-4:30pm

## 2021-09-01 NOTE — Plan of Care (Signed)
  Problem: Health Behavior/Discharge Planning: Goal: Ability to manage health-related needs will improve Outcome: Progressing   Problem: Clinical Measurements: Goal: Ability to maintain clinical measurements within normal limits will improve Outcome: Progressing Goal: Will remain free from infection Outcome: Progressing Goal: Diagnostic test results will improve Outcome: Progressing Goal: Respiratory complications will improve Outcome: Progressing Goal: Cardiovascular complication will be avoided Outcome: Progressing   Problem: Activity: Goal: Risk for activity intolerance will decrease Outcome: Progressing   Problem: Coping: Goal: Level of anxiety will decrease Outcome: Progressing   Problem: Pain Managment: Goal: General experience of comfort will improve Outcome: Progressing   Problem: Safety: Goal: Ability to remain free from injury will improve Outcome: Progressing

## 2021-09-02 LAB — CBC
HCT: 32.5 % — ABNORMAL LOW (ref 36.0–46.0)
Hemoglobin: 10.9 g/dL — ABNORMAL LOW (ref 12.0–15.0)
MCH: 31.4 pg (ref 26.0–34.0)
MCHC: 33.5 g/dL (ref 30.0–36.0)
MCV: 93.7 fL (ref 80.0–100.0)
Platelets: 215 10*3/uL (ref 150–400)
RBC: 3.47 MIL/uL — ABNORMAL LOW (ref 3.87–5.11)
RDW: 12.1 % (ref 11.5–15.5)
WBC: 9.9 10*3/uL (ref 4.0–10.5)
nRBC: 0 % (ref 0.0–0.2)

## 2021-09-02 LAB — BASIC METABOLIC PANEL
Anion gap: 6 (ref 5–15)
BUN: <5 mg/dL — ABNORMAL LOW (ref 8–23)
CO2: 25 mmol/L (ref 22–32)
Calcium: 8 mg/dL — ABNORMAL LOW (ref 8.9–10.3)
Chloride: 108 mmol/L (ref 98–111)
Creatinine, Ser: 0.65 mg/dL (ref 0.44–1.00)
GFR, Estimated: >60 mL/min (ref >60)
Glucose, Bld: 96 mg/dL (ref 70–99)
Potassium: 3.5 mmol/L (ref 3.5–5.1)
Sodium: 139 mmol/L (ref 135–145)

## 2021-09-02 MED ORDER — SIMETHICONE 80 MG PO CHEW
80.0000 mg | CHEWABLE_TABLET | Freq: Four times a day (QID) | ORAL | Status: DC | PRN
  Administered 2021-09-02 – 2021-09-06 (×11): 80 mg via ORAL
  Filled 2021-09-02 (×11): qty 1

## 2021-09-02 MED ORDER — HYDROCORTISONE 1 % EX CREA
TOPICAL_CREAM | Freq: Four times a day (QID) | CUTANEOUS | Status: DC
  Administered 2021-09-06: 1 via TOPICAL
  Filled 2021-09-02 (×2): qty 28

## 2021-09-02 MED ORDER — ALUM & MAG HYDROXIDE-SIMETH 200-200-20 MG/5ML PO SUSP: 30.0000 mL | Freq: Four times a day (QID) | ORAL | Status: DC | PRN

## 2021-09-02 NOTE — Progress Notes (Signed)
Subjective: CC: Doing well. Didn't like the FLD menu's as she said that she has an issue with dairy and most of the FLD options had dairy. Tolerated grits and soup without n/v. Her lower abdominal pain that is greater in the LLQ is improved this am, minimal at rest and a 4/10 with movement. She continues to pass flatus and reports liquid bm's yesterday. Mobilizing well.   Objective: Vital signs in last 24 hours: Temp:  [98.5 F (36.9 C)-99 F (37.2 C)] 98.5 F (36.9 C) (11/16 0516) Pulse Rate:  [71-73] 71 (11/16 0516) Resp:  [16] 16 (11/16 0516) BP: (112-125)/(53-62) 125/62 (11/16 0516) SpO2:  [95 %-97 %] 95 % (11/16 0516) Weight:  [81 kg] 81 kg (11/16 0533) Last BM Date: 09/01/21  Intake/Output from previous day: 11/15 0701 - 11/16 0700 In: 3420.7 [P.O.:1080; I.V.:2181.9; IV Piggyback:158.7] Out: -  Intake/Output this shift: No intake/output data recorded.  PE: Gen:  Alert, NAD, pleasant Pulm:  Normal rate and effort Abd: Soft, mild distension, minimally tender in the LLQ>suprapubic tenderness that is improved from yesterday and without rigidity or guarding. + bowel sounds Psych: A&Ox3  Skin: no rashes noted, warm and dry  Lab Results:  Recent Labs    09/01/21 0340 09/02/21 0345  WBC 12.2* 9.9  HGB 11.4* 10.9*  HCT 34.2* 32.5*  PLT 198 215   BMET Recent Labs    09/01/21 0340 09/02/21 0345  NA 138 139  K 3.8 3.5  CL 107 108  CO2 23 25  GLUCOSE 81 96  BUN 10 <5*  CREATININE 0.78 0.65  CALCIUM 7.8* 8.0*   PT/INR No results for input(s): LABPROT, INR in the last 72 hours. CMP     Component Value Date/Time   NA 139 09/02/2021 0345   K 3.5 09/02/2021 0345   CL 108 09/02/2021 0345   CO2 25 09/02/2021 0345   GLUCOSE 96 09/02/2021 0345   BUN <5 (L) 09/02/2021 0345   CREATININE 0.65 09/02/2021 0345   CALCIUM 8.0 (L) 09/02/2021 0345   PROT 6.3 (L) 08/30/2021 0352   ALBUMIN 3.2 (L) 08/30/2021 0352   AST 13 (L) 08/30/2021 0352   ALT 14 08/30/2021  0352   ALKPHOS 39 08/30/2021 0352   BILITOT 0.9 08/30/2021 0352   GFRNONAA >60 09/02/2021 0345   Lipase     Component Value Date/Time   LIPASE 25 08/29/2021 1124    Studies/Results: No results found.  Anti-infectives: Anti-infectives (From admission, onward)    Start     Dose/Rate Route Frequency Ordered Stop   08/29/21 2200  piperacillin-tazobactam (ZOSYN) IVPB 3.375 g  Status:  Discontinued        3.375 g 100 mL/hr over 30 Minutes Intravenous Every 8 hours 08/29/21 1831 08/29/21 1833   08/29/21 2200  piperacillin-tazobactam (ZOSYN) IVPB 3.375 g        3.375 g 12.5 mL/hr over 240 Minutes Intravenous Every 8 hours 08/29/21 1833     08/29/21 1315  piperacillin-tazobactam (ZOSYN) IVPB 3.375 g        3.375 g 100 mL/hr over 30 Minutes Intravenous  Once 08/29/21 1307 08/29/21 1359        Assessment/Plan Sigmoid diverticulitis with microperforation - Pain and exam improved. WBC normalized. Adv to soft diet. If tolerates, okay for discharge from our standpoint with 10 total days of abx. Will reach out to primary team to let them know.  - This is the patient's first episode of diverticulitis. She reports she gets  colonoscopies every 3 to 5 years for polyps in her colon.  She reports that is probably been at least 3 years since her last endoscopy which is usually done in [place]. She has a PCP at [facility]. Would recommend GI and PCP follow up.    FEN - Soft diet  VTE - SCDs, Lovenox  ID - Zosyn 11/12 >>   LOS: 4 days    Jillyn Ledger , [facility] 09/02/2021, 8:52 AM Please see Amion for pager number during day hours 7:00am-4:30pm

## 2021-09-02 NOTE — TOC Progression Note (Signed)
Transition of Care [facility]) - Progression Note    Patient Details  Name: Jaclyn York MRN: [id number] Date of Birth: December 20, 1955  [facility] CM/SW Contact  Leeroy Cha, RN Phone Number: 09/02/2021, 9:41 AM  Clinical Narrative:    65 year old female history of diverticulosis presenting to the ED with a 1 to 2-week history of worsening cramping lower abdominal pain.  Patient noted to have episode of nausea and 2 episodes of bilious emesis on the morning of admission.  CT abdomen and pelvis done concerning for sigmoid diverticulitis with microperforation, ileus.  Patient admitted, placed on bowel rest, empiric IV antibiotics, IV pain medication, IV antiemetics.  Supportive care.  General surgery consulted and following. TOC PLAN OF CARE: FOLLOWING FOR DC NEEDS FOLLOWING FOR PROGRESSION  Expected Discharge Plan: Home/Self Care Barriers to Discharge: Continued Medical Work up  Expected Discharge Plan and Services Expected Discharge Plan: Home/Self Care   Discharge Planning Services: CM Consult   Living arrangements for the past 2 months: Single Family Home                                       Social Determinants of Health (SDOH) Interventions    Readmission Risk Interventions No flowsheet data found.

## 2021-09-02 NOTE — Plan of Care (Signed)
  Problem: Health Behavior/Discharge Planning: Goal: Ability to manage health-related needs will improve Outcome: Progressing   Problem: Clinical Measurements: Goal: Ability to maintain clinical measurements within normal limits will improve Outcome: Progressing Goal: Will remain free from infection Outcome: Progressing Goal: Diagnostic test results will improve Outcome: Progressing Goal: Respiratory complications will improve Outcome: Progressing   

## 2021-09-02 NOTE — Progress Notes (Signed)
PROGRESS NOTE    Jaclyn York  UVO:[id number] DOB: Jul 05, 1956 DOA: 08/29/2021 PCP: Wayland Salinas, MD   Chief Complaint  Patient presents with   Abdominal Pain    Nausea,     Brief Narrative:  Patient is a pleasant 65 year old female history of diverticulosis presenting to the ED with a 1 to 2-week history of worsening cramping lower abdominal pain.  Patient noted to have episode of nausea and 2 episodes of bilious emesis on the morning of admission.  CT abdomen and pelvis done concerning for sigmoid diverticulitis with microperforation, ileus.  Patient admitted, placed on bowel rest, empiric IV antibiotics, IV pain medication, IV antiemetics.  Supportive care.  General surgery consulted and following.    Assessment & Plan:   Principal Problem:   Sigmoid diverticulitis Active Problems:   Ileus ([place])   Dehydration   Acute sigmoid diverticulitis: Improving. With IV antibiotics.  Advance diet as tolerated.  If n opain or nausea, or vomiting by tomorrow , plan for discharge.  Gen surgery consulted and recommendations given.    Papular rash on the back probably contact dermatitis.  Hydrocortisone cream prn.   Dehydration improved.    DVT prophylaxis: (Lovenox) Code Status: (Full code. ) Family Communication: none at bedside.  Disposition:   Status is: Inpatient  Remains inpatient appropriate because: IV antibiotics.        Consultants:  Surgery.  Procedures: none.   Antimicrobials:  Antibiotics Given (last 72 hours)     Date/Time Action Medication Dose Rate   08/30/21 2222 New Bag/Given   piperacillin-tazobactam (ZOSYN) IVPB 3.375 g 3.375 g 12.5 mL/hr   08/31/21 0555 New Bag/Given   piperacillin-tazobactam (ZOSYN) IVPB 3.375 g 3.375 g 12.5 mL/hr   08/31/21 1620 New Bag/Given   piperacillin-tazobactam (ZOSYN) IVPB 3.375 g 3.375 g 12.5 mL/hr   08/31/21 2043 New Bag/Given   piperacillin-tazobactam (ZOSYN) IVPB 3.375 g 3.375 g 12.5 mL/hr   09/01/21  0555 New Bag/Given   piperacillin-tazobactam (ZOSYN) IVPB 3.375 g 3.375 g 12.5 mL/hr   09/01/21 1402 New Bag/Given   piperacillin-tazobactam (ZOSYN) IVPB 3.375 g 3.375 g 12.5 mL/hr   09/01/21 2109 New Bag/Given   piperacillin-tazobactam (ZOSYN) IVPB 3.375 g 3.375 g 12.5 mL/hr   09/02/21 0526 New Bag/Given   piperacillin-tazobactam (ZOSYN) IVPB 3.375 g 3.375 g 12.5 mL/hr   09/02/21 1546 New Bag/Given   piperacillin-tazobactam (ZOSYN) IVPB 3.375 g 3.375 g 12.5 mL/hr         Subjective: Pain is better, no nausea or vomiting.   Objective: Vitals:   09/01/21 2107 09/02/21 0516 09/02/21 0533 09/02/21 1547  BP: 124/61 125/62  134/64  Pulse: 73 71  78  Resp: 16 16  20   Temp: 99 F (37.2 C) 98.5 F (36.9 C)  (!) 100.8 F (38.2 C)  TempSrc: Oral Oral  Oral  SpO2: 96% 95%  98%  Weight:   81 kg   Height:        Intake/Output Summary (Last 24 hours) at 09/02/2021 1855 Last data filed at 09/02/2021 1500 Gross per 24 hour  Intake 1195.92 ml  Output --  Net 1195.92 ml   Filed Weights   08/31/21 0450 09/01/21 0500 09/02/21 0533  Weight: 74.2 kg 74.3 kg 81 kg    Examination:  General exam: Appears calm and comfortable  Respiratory system: Clear to auscultation. Respiratory effort normal. Cardiovascular system: S1 & S2 heard, RRR. No JVD, . No pedal edema. Gastrointestinal system: Abdomen is nondistended, soft and nontender.  Normal bowel sounds  heard. Central nervous system: Alert and oriented. No focal neurological deficits. Extremities: Symmetric 5 x 5 power. Skin: papular rash on he back.  Psychiatry:Mood & affect appropriate.     Data Reviewed: I have personally reviewed following labs and imaging studies  CBC: Recent Labs  Lab 08/29/21 1124 08/30/21 0352 08/31/21 0341 09/01/21 0340 09/02/21 0345  WBC 14.4* 13.7* 13.1* 12.2* 9.9  NEUTROABS 13.1* 11.7* 10.9*  --   --   HGB 14.3 12.6 11.3* 11.4* 10.9*  HCT 42.4 37.3 34.3* 34.2* 32.5*  MCV 93.8 94.7 96.9 95.3  93.7  PLT 252 220 195 198 196    Basic Metabolic Panel: Recent Labs  Lab 08/29/21 1124 08/30/21 0352 08/31/21 0341 09/01/21 0340 09/02/21 0345  NA 138 141 138 138 139  K 3.9 4.0 3.9 3.8 3.5  CL 102 106 105 107 108  CO2 27 29 26 23 25   GLUCOSE 133* 111* 75 81 96  BUN 15 17 18 10  <5*  CREATININE 0.86 0.85 0.90 0.78 0.65  CALCIUM 8.9 8.4* 7.9* 7.8* 8.0*  MG  --  2.1  --  2.0  --     GFR: Estimated Creatinine Clearance: 80.3 mL/min (by C-G formula based on SCr of 0.65 mg/dL).  Liver Function Tests: Recent Labs  Lab 08/29/21 1124 08/30/21 0352  AST 17 13*  ALT 16 14  ALKPHOS 49 39  BILITOT 0.6 0.9  PROT 7.4 6.3*  ALBUMIN 3.9 3.2*    CBG: No results for input(s): GLUCAP in the last 168 hours.   Recent Results (from the past 240 hour(s))  Resp Panel by RT-PCR (Flu A&B, Covid) Nasopharyngeal Swab     Status: None   Collection Time: 08/29/21 11:24 AM   Specimen: Nasopharyngeal Swab; Nasopharyngeal(NP) swabs in vial transport medium  Result Value Ref Range Status   SARS Coronavirus 2 by RT PCR NEGATIVE NEGATIVE Final    Comment: (NOTE) SARS-CoV-2 target nucleic acids are NOT DETECTED.  The SARS-CoV-2 RNA is generally detectable in upper respiratory specimens during the acute phase of infection. The lowest concentration of SARS-CoV-2 viral copies this assay can detect is 138 copies/mL. A negative result does not preclude SARS-Cov-2 infection and should not be used as the sole basis for treatment or other patient management decisions. A negative result may occur with  improper specimen collection/handling, submission of specimen other than nasopharyngeal swab, presence of viral mutation(s) within the areas targeted by this assay, and inadequate number of viral copies(<138 copies/mL). A negative result must be combined with clinical observations, patient history, and epidemiological information. The expected result is Negative.  Fact Sheet for Patients:   EntrepreneurPulse.com.au  Fact Sheet for Healthcare Providers:  IncredibleEmployment.be  This test is no t yet approved or cleared by the [place] FDA and  has been authorized for detection and/or diagnosis of SARS-CoV-2 by FDA under an Emergency Use Authorization (EUA). This EUA will remain  in effect (meaning this test can be used) for the duration of the COVID-19 declaration under Section 564(b)(1) of the Act, 21 U.S.C.section 360bbb-3(b)(1), unless the authorization is terminated  or revoked sooner.       Influenza A by PCR NEGATIVE NEGATIVE Final   Influenza B by PCR NEGATIVE NEGATIVE Final    Comment: (NOTE) The Xpert Xpress SARS-CoV-2/FLU/RSV plus assay is intended as an aid in the diagnosis of influenza from Nasopharyngeal swab specimens and should not be used as a sole basis for treatment. Nasal washings and aspirates are unacceptable for Xpert Xpress SARS-CoV-2/FLU/RSV  testing.  Fact Sheet for Patients: EntrepreneurPulse.com.au  Fact Sheet for Healthcare Providers: IncredibleEmployment.be  This test is not yet approved or cleared by the [place] FDA and has been authorized for detection and/or diagnosis of SARS-CoV-2 by FDA under an Emergency Use Authorization (EUA). This EUA will remain in effect (meaning this test can be used) for the duration of the COVID-19 declaration under Section 564(b)(1) of the Act, 21 U.S.C. section 360bbb-3(b)(1), unless the authorization is terminated or revoked.  Performed at [facility], [address], [place],  [address]          Radiology Studies: No results found.      Scheduled Meds:  enoxaparin (LOVENOX) injection  40 mg Subcutaneous Q24H   senna-docusate  1 tablet Oral BID   sodium chloride flush  3 mL Intravenous Q12H   Continuous Infusions:  piperacillin-tazobactam (ZOSYN)  IV 3.375 g (09/02/21 1546)      LOS: 4 days        Hosie Poisson, MD [facility]   To contact the attending provider between 7A-7P or the covering provider during after hours 7P-7A, please log into the web site [email or web address] and access using universal [place] password for that web site. If you do not have the password, please call the hospital operator.  09/02/2021, 6:55 PM

## 2021-09-02 NOTE — Progress Notes (Addendum)
Pt noted to have increased pain and cramping after each meal. Simethicone decreased and minimize pain from 8-9 to 5. Abd pain consist of cramping and deep gnawing sensation. Tramadol 50 mg also effective. Pt concerned about pain after meals. Will update MD.SRP, RN

## 2021-09-03 ENCOUNTER — Inpatient Hospital Stay (HOSPITAL_COMMUNITY): Payer: BC Managed Care – PPO

## 2021-09-03 LAB — CBC
HCT: 32.6 % — ABNORMAL LOW (ref 36.0–46.0)
Hemoglobin: 10.8 g/dL — ABNORMAL LOW (ref 12.0–15.0)
MCH: 31.1 pg (ref 26.0–34.0)
MCHC: 33.1 g/dL (ref 30.0–36.0)
MCV: 93.9 fL (ref 80.0–100.0)
Platelets: 245 10*3/uL (ref 150–400)
RBC: 3.47 MIL/uL — ABNORMAL LOW (ref 3.87–5.11)
RDW: 12.2 % (ref 11.5–15.5)
WBC: 11.2 10*3/uL — ABNORMAL HIGH (ref 4.0–10.5)
nRBC: 0 % (ref 0.0–0.2)

## 2021-09-03 LAB — BASIC METABOLIC PANEL
Anion gap: 8 (ref 5–15)
BUN: <5 mg/dL — ABNORMAL LOW (ref 8–23)
CO2: 26 mmol/L (ref 22–32)
Calcium: 8 mg/dL — ABNORMAL LOW (ref 8.9–10.3)
Chloride: 104 mmol/L (ref 98–111)
Creatinine, Ser: 0.77 mg/dL (ref 0.44–1.00)
GFR, Estimated: >60 mL/min (ref >60)
Glucose, Bld: 101 mg/dL — ABNORMAL HIGH (ref 70–99)
Potassium: 3.2 mmol/L — ABNORMAL LOW (ref 3.5–5.1)
Sodium: 138 mmol/L (ref 135–145)

## 2021-09-03 LAB — MAGNESIUM: Magnesium: 2 mg/dL (ref 1.7–2.4)

## 2021-09-03 LAB — PHOSPHORUS: Phosphorus: 2.7 mg/dL (ref 2.5–4.6)

## 2021-09-03 MED ORDER — POTASSIUM CHLORIDE CRYS ER 20 MEQ PO TBCR
40.0000 meq | EXTENDED_RELEASE_TABLET | Freq: Once | ORAL | Status: AC
  Administered 2021-09-03: 06:00:00 40 meq via ORAL
  Filled 2021-09-03: qty 2

## 2021-09-03 MED ORDER — IOHEXOL 350 MG/ML SOLN
80.0000 mL | Freq: Once | INTRAVENOUS | Status: AC | PRN
  Administered 2021-09-03: 12:00:00 80 mL via INTRAVENOUS

## 2021-09-03 MED ORDER — ENSURE ENLIVE PO LIQD
237.0000 mL | Freq: Two times a day (BID) | ORAL | Status: DC
Start: 2021-09-04 — End: 2021-09-07
  Administered 2021-09-05 – 2021-09-06 (×3): 237 mL via ORAL

## 2021-09-03 MED ORDER — MORPHINE SULFATE (PF) 2 MG/ML IV SOLN
2.0000 mg | INTRAVENOUS | Status: DC | PRN
Start: 2021-09-03 — End: 2021-09-07
  Administered 2021-09-05: 2 mg via INTRAVENOUS
  Filled 2021-09-03: qty 1

## 2021-09-03 NOTE — Progress Notes (Signed)
Lovenox held per IR, Lennette Bihari for abscess drain in am. SRP, RN

## 2021-09-03 NOTE — Progress Notes (Signed)
PROGRESS NOTE    Jaclyn York  URK:[id number] DOB: 01-15-1956 DOA: 08/29/2021 PCP: Wayland Salinas, MD   Chief Complaint  Patient presents with   Abdominal Pain    Nausea,     Brief Narrative:  Patient is a pleasant 64 year old female history of diverticulosis presenting to the ED with a 1 to 2-week history of worsening cramping lower abdominal pain.  Patient noted to have episode of nausea and 2 episodes of bilious emesis on the morning of admission.  CT abdomen and pelvis done concerning for sigmoid diverticulitis with microperforation, ileus.  Patient admitted, placed on bowel rest, empiric IV antibiotics, IV pain medication, IV antiemetics.  Supportive care.  General surgery consulted and following.     Assessment & Plan:   Principal Problem:   Sigmoid diverticulitis Active Problems:   Ileus ([place])   Dehydration   Acute sigmoid diverticulitis with microperforation, with resultant multiple abscesses on repeat CT abdomen and pelvis.  Gen surgery consulted, await recommendations.  Continue with IV fluids and IV zosyn. Conservative therapies were unsuccessful.  Pain control with IV morphine and Oral tramadol Febrile overnight, worsening leukocytosis. Continue with NPO.    Papular rash on the back probably contact dermatitis.  Hydrocortisone cream prn.   Dehydration improved.   Hypokalemia:  Replaced.  Check magnesium levels.  Recheck potassium in am.    DVT prophylaxis: (Lovenox) Code Status: (Full code. ) Family Communication: none at bedside.  Disposition:   Status is: Inpatient  Remains inpatient appropriate because: IV antibiotics.        Consultants:  Surgery.  Procedures: none.   Antimicrobials:  Antibiotics Given (last 72 hours)     Date/Time Action Medication Dose Rate   08/31/21 1620 New Bag/Given   piperacillin-tazobactam (ZOSYN) IVPB 3.375 g 3.375 g 12.5 mL/hr   08/31/21 2043 New Bag/Given   piperacillin-tazobactam (ZOSYN) IVPB  3.375 g 3.375 g 12.5 mL/hr   09/01/21 0555 New Bag/Given   piperacillin-tazobactam (ZOSYN) IVPB 3.375 g 3.375 g 12.5 mL/hr   09/01/21 1402 New Bag/Given   piperacillin-tazobactam (ZOSYN) IVPB 3.375 g 3.375 g 12.5 mL/hr   09/01/21 2109 New Bag/Given   piperacillin-tazobactam (ZOSYN) IVPB 3.375 g 3.375 g 12.5 mL/hr   09/02/21 0526 New Bag/Given   piperacillin-tazobactam (ZOSYN) IVPB 3.375 g 3.375 g 12.5 mL/hr   09/02/21 1546 New Bag/Given   piperacillin-tazobactam (ZOSYN) IVPB 3.375 g 3.375 g 12.5 mL/hr   09/02/21 2211 New Bag/Given   piperacillin-tazobactam (ZOSYN) IVPB 3.375 g 3.375 g 12.5 mL/hr   09/03/21 0549 New Bag/Given   piperacillin-tazobactam (ZOSYN) IVPB 3.375 g 3.375 g 12.5 mL/hr   09/03/21 1429 New Bag/Given   piperacillin-tazobactam (ZOSYN) IVPB 3.375 g 3.375 g 12.5 mL/hr         Subjective: Persistent abdominal pain , with some nausea, no vomiting.    Objective: Vitals:   09/02/21 1547 09/02/21 2014 09/03/21 0510 09/03/21 0545  BP: 134/64 126/67 123/69   Pulse: 78 79 71   Resp: 20 16 16    Temp: (!) 100.8 F (38.2 C) 99.5 F (37.5 C) 98.8 F (37.1 C)   TempSrc: Oral Oral Oral   SpO2: 98% 96% 96%   Weight:    79.8 kg  Height:        Intake/Output Summary (Last 24 hours) at 09/03/2021 1457 Last data filed at 09/03/2021 0600 Gross per 24 hour  Intake 634.87 ml  Output --  Net 634.87 ml    Filed Weights   09/01/21 0500 09/02/21 0533 09/03/21 0545  Weight: 74.3 kg 81 kg 79.8 kg    Examination:  General exam: Appears calm and comfortable  Respiratory system: Clear to auscultation. Respiratory effort normal. Cardiovascular system: S1 & S2 heard, RRR. No JVD, No pedal edema. Gastrointestinal system: Abdomen is firm,  tender in the lower quadrant and bowel sounds minimal.  Central nervous system: Alert and oriented. No focal neurological deficits. Extremities: Symmetric 5 x 5 power. Skin: No rashes, lesions or ulcers Psychiatry: Mood & affect  appropriate.     Data Reviewed: I have personally reviewed following labs and imaging studies  CBC: Recent Labs  Lab 08/29/21 1124 08/30/21 0352 08/31/21 0341 09/01/21 0340 09/02/21 0345 09/03/21 0332  WBC 14.4* 13.7* 13.1* 12.2* 9.9 11.2*  NEUTROABS 13.1* 11.7* 10.9*  --   --   --   HGB 14.3 12.6 11.3* 11.4* 10.9* 10.8*  HCT 42.4 37.3 34.3* 34.2* 32.5* 32.6*  MCV 93.8 94.7 96.9 95.3 93.7 93.9  PLT 252 220 195 198 215 245     Basic Metabolic Panel: Recent Labs  Lab 08/30/21 0352 08/31/21 0341 09/01/21 0340 09/02/21 0345 09/03/21 0332  NA 141 138 138 139 138  K 4.0 3.9 3.8 3.5 3.2*  CL 106 105 107 108 104  CO2 29 26 23 25 26   GLUCOSE 111* 75 81 96 101*  BUN 17 18 10  <5* <5*  CREATININE 0.85 0.90 0.78 0.65 0.77  CALCIUM 8.4* 7.9* 7.8* 8.0* 8.0*  MG 2.1  --  2.0  --   --   PHOS  --   --   --   --  2.7     GFR: Estimated Creatinine Clearance: 79.9 mL/min (by C-G formula based on SCr of 0.77 mg/dL).  Liver Function Tests: Recent Labs  Lab 08/29/21 1124 08/30/21 0352  AST 17 13*  ALT 16 14  ALKPHOS 49 39  BILITOT 0.6 0.9  PROT 7.4 6.3*  ALBUMIN 3.9 3.2*     CBG: No results for input(s): GLUCAP in the last 168 hours.   Recent Results (from the past 240 hour(s))  Resp Panel by RT-PCR (Flu A&B, Covid) Nasopharyngeal Swab     Status: None   Collection Time: 08/29/21 11:24 AM   Specimen: Nasopharyngeal Swab; Nasopharyngeal(NP) swabs in vial transport medium  Result Value Ref Range Status   SARS Coronavirus 2 by RT PCR NEGATIVE NEGATIVE Final    Comment: (NOTE) SARS-CoV-2 target nucleic acids are NOT DETECTED.  The SARS-CoV-2 RNA is generally detectable in upper respiratory specimens during the acute phase of infection. The lowest concentration of SARS-CoV-2 viral copies this assay can detect is 138 copies/mL. A negative result does not preclude SARS-Cov-2 infection and should not be used as the sole basis for treatment or other patient management  decisions. A negative result may occur with  improper specimen collection/handling, submission of specimen other than nasopharyngeal swab, presence of viral mutation(s) within the areas targeted by this assay, and inadequate number of viral copies(<138 copies/mL). A negative result must be combined with clinical observations, patient history, and epidemiological information. The expected result is Negative.  Fact Sheet for Patients:  EntrepreneurPulse.com.au  Fact Sheet for Healthcare Providers:  IncredibleEmployment.be  This test is no t yet approved or cleared by the [place] FDA and  has been authorized for detection and/or diagnosis of SARS-CoV-2 by FDA under an Emergency Use Authorization (EUA). This EUA will remain  in effect (meaning this test can be used) for the duration of the COVID-19 declaration under Section 564(b)(1) of  the Act, 21 U.S.C.section 360bbb-3(b)(1), unless the authorization is terminated  or revoked sooner.       Influenza A by PCR NEGATIVE NEGATIVE Final   Influenza B by PCR NEGATIVE NEGATIVE Final    Comment: (NOTE) The Xpert Xpress SARS-CoV-2/FLU/RSV plus assay is intended as an aid in the diagnosis of influenza from Nasopharyngeal swab specimens and should not be used as a sole basis for treatment. Nasal washings and aspirates are unacceptable for Xpert Xpress SARS-CoV-2/FLU/RSV testing.  Fact Sheet for Patients: EntrepreneurPulse.com.au  Fact Sheet for Healthcare Providers: IncredibleEmployment.be  This test is not yet approved or cleared by the [place] FDA and has been authorized for detection and/or diagnosis of SARS-CoV-2 by FDA under an Emergency Use Authorization (EUA). This EUA will remain in effect (meaning this test can be used) for the duration of the COVID-19 declaration under Section 564(b)(1) of the Act, 21 U.S.C. section 360bbb-3(b)(1), unless the  authorization is terminated or revoked.  Performed at [facility], [address], [place], [place] [address]           Radiology Studies: CT ABDOMEN PELVIS W CONTRAST  Result Date: 09/03/2021 CLINICAL DATA:  Follow-up sigmoid diverticulitis. Patient with increasing pain, fever and rising white blood cell count. EXAM: CT ABDOMEN AND PELVIS WITH CONTRAST TECHNIQUE: Multidetector CT imaging of the abdomen and pelvis was performed using the standard protocol following bolus administration of intravenous contrast. CONTRAST:  60mL OMNIPAQUE IOHEXOL 350 MG/ML SOLN COMPARISON:  CT scan 08/29/2021 FINDINGS: Lower chest: New small bilateral pleural effusions and overlying bibasilar atelectasis. Hepatobiliary: No hepatic lesions or intrahepatic biliary dilatation. The gallbladder is unremarkable. No common bile duct dilatation. Pancreas: No mass, inflammation or ductal dilatation. Spleen: Normal size.  No focal lesions. Adrenals/Urinary Tract: Adrenal glands and kidneys are unremarkable and stable. Stable left-sided parapelvic renal cyst. The bladder is grossly. Stomach/Bowel: The stomach, duodenum and small bowel are unremarkable. No inflammatory changes or obstructive findings. The terminal ileum is normal. The appendix is. Persistent changes of acute diverticulitis involving the mid to lower sigmoid colon. There is now a more clearly defined intramural abscess measuring 2.8 x 2.3 cm. There are also multiple pelvic abscesses, new since the prior study. The largest abscess is in the cul-de-sac region and measures 7.6 x 5.5 cm. Anterior to the uterus there is a smaller abscess measuring 4.3 cm. This may communicate with the cul-de-sac abscess when seen on the sagittal images (image 65/7). In the left adnexal region there is a small abscess measuring 2.3 cm. Vascular/Lymphatic: Stable aortic and iliac artery calcifications. No aneurysm or dissection. The branch vessels are patent. The major venous  structures are patent. No mesenteric or retroperitoneal mass or adenopathy. There are small likely reactive pelvic sidewall nodes Reproductive: The uterus and ovaries are surrounded by inflammation abscesses. Other: No inguinal mass or hernia.  No subcutaneous lesions. Musculoskeletal: No significant bony findings. IMPRESSION: 1. Persistent changes of acute diverticulitis involving the mid to lower sigmoid colon. 2. Multiple new pelvic abscesses as detailed above. 3. New small bilateral pleural effusions and overlying bibasilar atelectasis. 4. Aortic atherosclerosis. These results will be called to the ordering clinician or representative by the Radiologist Assistant, and communication documented in the PACS or [employer]. Aortic Atherosclerosis (ICD10-I70.0). Electronically Signed   By: Marijo Sanes M.D.   On: 09/03/2021 14:20        Scheduled Meds:  enoxaparin (LOVENOX) injection  40 mg Subcutaneous Q24H   hydrocortisone cream   Topical QID  senna-docusate  1 tablet Oral BID   sodium chloride flush  3 mL Intravenous Q12H   Continuous Infusions:  piperacillin-tazobactam (ZOSYN)  IV 3.375 g (09/03/21 1429)     LOS: 5 days        Hosie Poisson, MD [facility]   To contact the attending provider between 7A-7P or the covering provider during after hours 7P-7A, please log into the web site [email or web address] and access using universal [place] password for that web site. If you do not have the password, please call the hospital operator.  09/03/2021, 2:57 PM

## 2021-09-03 NOTE — Progress Notes (Signed)
Pt abd is tight and distended, c//o increased pain and discomfort. Medicated as ordered.MD messaged, will page.Bonna Gains, RN

## 2021-09-03 NOTE — Plan of Care (Signed)
  Problem: Health Behavior/Discharge Planning: Goal: Ability to manage health-related needs will improve Outcome: Progressing   Problem: Clinical Measurements: Goal: Ability to maintain clinical measurements within normal limits will improve Outcome: Progressing Goal: Will remain free from infection Outcome: Progressing Goal: Diagnostic test results will improve Outcome: Progressing Goal: Respiratory complications will improve Outcome: Progressing   

## 2021-09-03 NOTE — Progress Notes (Signed)
Subjective: CC: Patient reports after being placed on the soft diet, anytime she would eat, 20-30 minutes later she would get increasing lower abdominal pain. She denies n/v. Continues to pass flatus but no bm yesterday. Febrile to 100.8 overnight.   Objective: Vital signs in last 24 hours: Temp:  [98.8 F (37.1 C)-100.8 F (38.2 C)] 98.8 F (37.1 C) (11/17 0510) Pulse Rate:  [71-79] 71 (11/17 0510) Resp:  [16-20] 16 (11/17 0510) BP: (123-134)/(64-69) 123/69 (11/17 0510) SpO2:  [96 %-98 %] 96 % (11/17 0510) Weight:  [79.8 kg] 79.8 kg (11/17 0545) Last BM Date: 09/01/21  Intake/Output from previous day: 11/16 0701 - 11/17 0700 In: 634.9 [P.O.:480; IV Piggyback:154.9] Out: -  Intake/Output this shift: No intake/output data recorded.  PE: Gen:  Alert, NAD, pleasant Pulm:  Normal rate and effort Abd: Soft, mild distension, more tender in the LLQ>suprapubic tenderness compared to yesterday. No rigidity or guarding. + bowel sounds Psych: A&Ox3  Skin: no rashes noted, warm and dry  Lab Results:  Recent Labs    09/02/21 0345 09/03/21 0332  WBC 9.9 11.2*  HGB 10.9* 10.8*  HCT 32.5* 32.6*  PLT 215 245   BMET Recent Labs    09/02/21 0345 09/03/21 0332  NA 139 138  K 3.5 3.2*  CL 108 104  CO2 25 26  GLUCOSE 96 101*  BUN <5* <5*  CREATININE 0.65 0.77  CALCIUM 8.0* 8.0*   PT/INR No results for input(s): LABPROT, INR in the last 72 hours. CMP     Component Value Date/Time   NA 138 09/03/2021 0332   K 3.2 (L) 09/03/2021 0332   CL 104 09/03/2021 0332   CO2 26 09/03/2021 0332   GLUCOSE 101 (H) 09/03/2021 0332   BUN <5 (L) 09/03/2021 0332   CREATININE 0.77 09/03/2021 0332   CALCIUM 8.0 (L) 09/03/2021 0332   PROT 6.3 (L) 08/30/2021 0352   ALBUMIN 3.2 (L) 08/30/2021 0352   AST 13 (L) 08/30/2021 0352   ALT 14 08/30/2021 0352   ALKPHOS 39 08/30/2021 0352   BILITOT 0.9 08/30/2021 0352   GFRNONAA >60 09/03/2021 0332   Lipase     Component Value Date/Time    LIPASE 25 08/29/2021 1124    Studies/Results: No results found.  Anti-infectives: Anti-infectives (From admission, onward)    Start     Dose/Rate Route Frequency Ordered Stop   08/29/21 2200  piperacillin-tazobactam (ZOSYN) IVPB 3.375 g  Status:  Discontinued        3.375 g 100 mL/hr over 30 Minutes Intravenous Every 8 hours 08/29/21 1831 08/29/21 1833   08/29/21 2200  piperacillin-tazobactam (ZOSYN) IVPB 3.375 g        3.375 g 12.5 mL/hr over 240 Minutes Intravenous Every 8 hours 08/29/21 1833     08/29/21 1315  piperacillin-tazobactam (ZOSYN) IVPB 3.375 g        3.375 g 100 mL/hr over 30 Minutes Intravenous  Once 08/29/21 1307 08/29/21 1359        Assessment/Plan Sigmoid diverticulitis with microperforation - Patient with increased pain after being placed on soft diet with rising wbc and fever overnight. Will obtain CT A/P today with oral and IV contrast. Will leave NPO incase this shows any IAA that would be amenable to IR drainage. If negative, likely can go back on FLD.  - Cont IV abx  - Hopefully will improve with conservative therapies as mentioned above. If she was to fail to improve with conservative therapy she may  require surgery that would likely result in a colectomy and colostomy.    FEN - NPO, IVF per [facility] VTE - SCDs, Lovenox ID - Zosyn   LOS: 5 days    Jillyn Ledger , [facility] 09/03/2021, 8:45 AM Please see Amion for pager number during day hours 7:00am-4:30pm

## 2021-09-03 NOTE — Consult Note (Signed)
Chief Complaint: Patient was seen in consultation today for CT-guided drainage of pelvic abscess Chief Complaint  Patient presents with   Abdominal Pain    Nausea,     Referring Physician(s): Clent Demark   Supervising Physician: Aletta Edouard  Patient Status: [facility] - In-pt  History of Present Illness: Jaclyn York is a 65 y.o. female with past medical history of diverticulosis who recently presented to [facility] with 1 to 2-week history of worsening crampy lower abdominal pain along with intermittent nausea and some bilious emesis the morning of admission.  She is currently on IV antibiotic therapy but continues to have some mild temperature elevations and mild leukocytosis.  Latest CT abdomen pelvis performed today revealed:  1. Persistent changes of acute diverticulitis involving the mid to lower sigmoid colon. 2. Multiple new pelvic abscesses as detailed above. 3. New small bilateral pleural effusions and overlying bibasilar atelectasis. 4. Aortic atherosclerosis  Current labs include WBC 11.2, hemoglobin 10.8, platelets normal, creatinine normal, potassium 3.2, PT/INR normal.  Patient was seen by surgical team and request now received for image guided pelvic abscess drainage.   Past Medical History:  Diagnosis Date   Diverticulosis     History reviewed. No pertinent surgical history.  Allergies: Patient has no known allergies.  Medications: Prior to Admission medications   Medication Sig Start Date End Date Taking? Authorizing Provider  estradiol (VIVELLE-DOT) 0.025 MG/24HR Place 1 patch onto the skin 2 (two) times a week. Monday and Thursday 08/14/21  Yes [provider]  levocetirizine (XYZAL) 5 MG tablet Take 5 mg by mouth every evening.   Yes [provider]  progesterone (PROMETRIUM) 100 MG capsule Take 100 mg by mouth at bedtime. 07/22/21  Yes [provider]     Family History  Problem Relation Age of Onset    Diverticulosis Mother    Lung cancer Father     Social History   Socioeconomic History   Marital status: Married    Spouse name: Not on file   Number of children: Not on file   Years of education: Not on file   Highest education level: Not on file  Occupational History   Not on file  Tobacco Use   Smoking status: Former    Types: Cigarettes   Smokeless tobacco: Never  Vaping Use   Vaping Use: Never used  Substance and Sexual Activity   Alcohol use: Yes    Comment: SOCIALLY   Drug use: Never   Sexual activity: Yes  Other Topics Concern   Not on file  Social History Narrative   Not on file   Social Determinants of Health   Financial Resource Strain: Not on file  Food Insecurity: Not on file  Transportation Needs: Not on file  Physical Activity: Not on file  Stress: Not on file  Social Connections: Not on file     Review of Systems see above: Has occasional headache, but denies chest pain or bleeding.  She does have some lower abdominal and back pain.  Vital Signs: BP 123/69 (BP Location: Left Arm)   Pulse 71   Temp 98.8 F (37.1 C) (Oral)   Resp 16   Ht 5\' 8"  (1.727 m)   Wt 175 lb 14.4 oz (79.8 kg)   SpO2 96%   BMI 26.75 kg/m   Physical Exam patient awake, alert.  Chest with slightly diminished breath sounds bases.  Heart with regular rate and rhythm.  Abdomen soft, positive bowel sounds,  some mild distention, tenderness noted anterior pelvic region.  No lower extremity edema.  Imaging: CT ABDOMEN PELVIS W CONTRAST  Result Date: 09/03/2021 CLINICAL DATA:  Follow-up sigmoid diverticulitis. Patient with increasing pain, fever and rising white blood cell count. EXAM: CT ABDOMEN AND PELVIS WITH CONTRAST TECHNIQUE: Multidetector CT imaging of the abdomen and pelvis was performed using the standard protocol following bolus administration of intravenous contrast. CONTRAST:  35mL OMNIPAQUE IOHEXOL 350 MG/ML SOLN COMPARISON:  CT scan 08/29/2021 FINDINGS: Lower chest:  New small bilateral pleural effusions and overlying bibasilar atelectasis. Hepatobiliary: No hepatic lesions or intrahepatic biliary dilatation. The gallbladder is unremarkable. No common bile duct dilatation. Pancreas: No mass, inflammation or ductal dilatation. Spleen: Normal size.  No focal lesions. Adrenals/Urinary Tract: Adrenal glands and kidneys are unremarkable and stable. Stable left-sided parapelvic renal cyst. The bladder is grossly. Stomach/Bowel: The stomach, duodenum and small bowel are unremarkable. No inflammatory changes or obstructive findings. The terminal ileum is normal. The appendix is. Persistent changes of acute diverticulitis involving the mid to lower sigmoid colon. There is now a more clearly defined intramural abscess measuring 2.8 x 2.3 cm. There are also multiple pelvic abscesses, new since the prior study. The largest abscess is in the cul-de-sac region and measures 7.6 x 5.5 cm. Anterior to the uterus there is a smaller abscess measuring 4.3 cm. This may communicate with the cul-de-sac abscess when seen on the sagittal images (image 65/7). In the left adnexal region there is a small abscess measuring 2.3 cm. Vascular/Lymphatic: Stable aortic and iliac artery calcifications. No aneurysm or dissection. The branch vessels are patent. The major venous structures are patent. No mesenteric or retroperitoneal mass or adenopathy. There are small likely reactive pelvic sidewall nodes Reproductive: The uterus and ovaries are surrounded by inflammation abscesses. Other: No inguinal mass or hernia.  No subcutaneous lesions. Musculoskeletal: No significant bony findings. IMPRESSION: 1. Persistent changes of acute diverticulitis involving the mid to lower sigmoid colon. 2. Multiple new pelvic abscesses as detailed above. 3. New small bilateral pleural effusions and overlying bibasilar atelectasis. 4. Aortic atherosclerosis. These results will be called to the ordering clinician or representative by  the Radiologist Assistant, and communication documented in the PACS or [employer]. Aortic Atherosclerosis (ICD10-I70.0). Electronically Signed   By: Marijo Sanes M.D.   On: 09/03/2021 14:20   CT ABDOMEN PELVIS W CONTRAST  Result Date: 08/29/2021 CLINICAL DATA:  Left lower quadrant abdomen pain assess for diverticulitis. EXAM: CT ABDOMEN AND PELVIS WITH CONTRAST TECHNIQUE: Multidetector CT imaging of the abdomen and pelvis was performed using the standard protocol following bolus administration of intravenous contrast. CONTRAST:  126mL OMNIPAQUE IOHEXOL 300 MG/ML  SOLN COMPARISON:  None. FINDINGS: Lower chest: No acute abnormality. Hepatobiliary: No focal liver abnormality is seen. No gallstones, gallbladder wall thickening, or biliary dilatation. Pancreas: Unremarkable. No pancreatic ductal dilatation or surrounding inflammatory changes. Spleen: Normal in size without focal abnormality. Adrenals/Urinary Tract: Adrenal glands are unremarkable. Kidneys are normal, without renal calculi, focal lesion, or hydronephrosis. Bladder is unremarkable. Left parapelvic renal cysts are identified. Stomach/Bowel: There is stranding inflammation surrounding the sigmoid colon with a few small foci of air adjacent to the area inflammation suggesting small focal micro perforation. Free fluid is identified in the pelvis. There is mild dilatation of the small bowel loops in the pelvis likely secondary to focal ileus. The small bowel loops are otherwise normal. The stomach is normal. The appendix is normal. Vascular/Lymphatic: Aortic atherosclerosis. No enlarged abdominal or pelvic lymph nodes. Reproductive: Uterine  fibroids are noted. No definite adnexal masses are noted. Other: Free fluid in the pelvis as previously described. Musculoskeletal: Degenerative joint changes of the lower lumbar spine are identified. IMPRESSION: 1. Findings consistent with sigmoid diverticulitis with a few small foci of air adjacent to the area  inflammation suggesting small focal micro perforation. Free fluid is identified in the pelvis. 2. Mild dilatation of the small bowel loops in the pelvis likely secondary to focal ileus. 3. Aortic atherosclerosis. Aortic Atherosclerosis (ICD10-I70.0). These results were called by telephone at the time of interpretation on 08/29/2021 at 12:57 pm to provider [facility] , who verbally acknowledged these results. Electronically Signed   By: Abelardo Diesel M.D.   On: 08/29/2021 12:57    Labs:  CBC: Recent Labs    08/31/21 0341 09/01/21 0340 09/02/21 0345 09/03/21 0332  WBC 13.1* 12.2* 9.9 11.2*  HGB 11.3* 11.4* 10.9* 10.8*  HCT 34.3* 34.2* 32.5* 32.6*  PLT 195 198 215 245    COAGS: Recent Labs    08/30/21 0352  INR 1.2    BMP: Recent Labs    08/31/21 0341 09/01/21 0340 09/02/21 0345 09/03/21 0332  NA 138 138 139 138  K 3.9 3.8 3.5 3.2*  CL 105 107 108 104  CO2 26 23 25 26   GLUCOSE 75 81 96 101*  BUN 18 10 <5* <5*  CALCIUM 7.9* 7.8* 8.0* 8.0*  CREATININE 0.90 0.78 0.65 0.77  GFRNONAA >60 >60 >60 >60    LIVER FUNCTION TESTS: Recent Labs    08/29/21 1124 08/30/21 0352  BILITOT 0.6 0.9  AST 17 13*  ALT 16 14  ALKPHOS 49 39  PROT 7.4 6.3*  ALBUMIN 3.9 3.2*    TUMOR MARKERS: No results for input(s): AFPTM, CEA, CA199, CHROMGRNA in the last 8760 hours.  Assessment and Plan: 65 y.o. female with past medical history of diverticulosis who recently presented to [facility] ED with 1 to 2-week history of worsening crampy lower abdominal pain along with intermittent nausea and some bilious emesis the morning of admission.  She is currently on IV antibiotic therapy but continues to have some mild temperature elevations and mild leukocytosis.  Latest CT abdomen pelvis performed today revealed:  1. Persistent changes of acute diverticulitis involving the mid to lower sigmoid colon. 2. Multiple new pelvic abscesses as detailed above. 3. New small bilateral pleural effusions  and overlying bibasilar atelectasis. 4. Aortic atherosclerosis  Current labs include WBC 11.2, hemoglobin 10.8, platelets normal, creatinine normal, potassium 3.2, PT/INR normal.  Patient was seen by surgical team and request now received for image guided pelvic abscess drainage.  Imaging studies were reviewed by Dr. Serafina Royals.Risks and benefits discussed with the patient /spouse including bleeding, infection, damage to adjacent structures, bowel perforation/fistula connection, and sepsis.  All of the patient's questions were answered, patient is agreeable to proceed. Consent signed and in chart.  Procedure scheduled for 11/18.  Thank you for this interesting consult.  I greatly enjoyed meeting Oluwasemilore Pascuzzi and look forward to participating in their care.  A copy of this report was sent to the requesting provider on this date.  Electronically Signed: D. Rowe Robert, PA-C 09/03/2021, 4:28 PM   I spent a total of  25 minutes   in face to face in clinical consultation, greater than 50% of which was counseling/coordinating care for CT-guided drainage of pelvic abscess

## 2021-09-04 ENCOUNTER — Inpatient Hospital Stay (HOSPITAL_COMMUNITY): Payer: BC Managed Care – PPO

## 2021-09-04 ENCOUNTER — Encounter (HOSPITAL_COMMUNITY): Payer: Self-pay | Admitting: Internal Medicine

## 2021-09-04 LAB — CBC
HCT: 33.4 % — ABNORMAL LOW (ref 36.0–46.0)
Hemoglobin: 11.1 g/dL — ABNORMAL LOW (ref 12.0–15.0)
MCH: 31.1 pg (ref 26.0–34.0)
MCHC: 33.2 g/dL (ref 30.0–36.0)
MCV: 93.6 fL (ref 80.0–100.0)
Platelets: 275 10*3/uL (ref 150–400)
RBC: 3.57 MIL/uL — ABNORMAL LOW (ref 3.87–5.11)
RDW: 12.1 % (ref 11.5–15.5)
WBC: 10 10*3/uL (ref 4.0–10.5)
nRBC: 0 % (ref 0.0–0.2)

## 2021-09-04 LAB — BASIC METABOLIC PANEL
Anion gap: 9 (ref 5–15)
BUN: 5 mg/dL — ABNORMAL LOW (ref 8–23)
CO2: 27 mmol/L (ref 22–32)
Calcium: 8.1 mg/dL — ABNORMAL LOW (ref 8.9–10.3)
Chloride: 102 mmol/L (ref 98–111)
Creatinine, Ser: 0.68 mg/dL (ref 0.44–1.00)
GFR, Estimated: >60 mL/min (ref >60)
Glucose, Bld: 101 mg/dL — ABNORMAL HIGH (ref 70–99)
Potassium: 3.4 mmol/L — ABNORMAL LOW (ref 3.5–5.1)
Sodium: 138 mmol/L (ref 135–145)

## 2021-09-04 MED ORDER — MIDAZOLAM HCL 2 MG/2ML IJ SOLN
INTRAMUSCULAR | Status: AC
  Filled 2021-09-04: qty 4

## 2021-09-04 MED ORDER — SODIUM CHLORIDE 0.9% FLUSH
10.0000 mL | Freq: Three times a day (TID) | INTRAVENOUS | Status: DC
  Administered 2021-09-04 – 2021-09-07 (×9): 10 mL

## 2021-09-04 MED ORDER — FENTANYL CITRATE (PF) 100 MCG/2ML IJ SOLN
INTRAMUSCULAR | Status: AC | PRN
  Administered 2021-09-04: 50 ug via INTRAVENOUS

## 2021-09-04 MED ORDER — FENTANYL CITRATE (PF) 100 MCG/2ML IJ SOLN
INTRAMUSCULAR | Status: AC
  Filled 2021-09-04: qty 4

## 2021-09-04 MED ORDER — POTASSIUM CHLORIDE 10 MEQ/100ML IV SOLN
10.0000 meq | INTRAVENOUS | Status: AC
  Administered 2021-09-04: 10 meq via INTRAVENOUS
  Filled 2021-09-04 (×2): qty 100

## 2021-09-04 MED ORDER — FENTANYL CITRATE (PF) 100 MCG/2ML IJ SOLN
INTRAMUSCULAR | Status: AC | PRN
  Administered 2021-09-04: 25 ug via INTRAVENOUS

## 2021-09-04 MED ORDER — SODIUM CHLORIDE 0.9 % IV SOLN: INTRAVENOUS | Status: DC

## 2021-09-04 MED ORDER — LIDOCAINE HCL (PF) 1 % IJ SOLN
INTRAMUSCULAR | Status: AC | PRN
  Administered 2021-09-04: 10 mL via INTRADERMAL

## 2021-09-04 MED ORDER — NALOXONE HCL 0.4 MG/ML IJ SOLN
INTRAMUSCULAR | Status: AC
  Filled 2021-09-04: qty 1

## 2021-09-04 MED ORDER — MIDAZOLAM HCL 2 MG/2ML IJ SOLN
INTRAMUSCULAR | Status: AC | PRN
  Administered 2021-09-04: 1 mg via INTRAVENOUS

## 2021-09-04 MED ORDER — FLUMAZENIL 0.5 MG/5ML IV SOLN
INTRAVENOUS | Status: AC
  Filled 2021-09-04: qty 5

## 2021-09-04 NOTE — Progress Notes (Signed)
PROGRESS NOTE    Jaclyn York  RKY:[id number] DOB: 30-Oct-1955 DOA: 08/29/2021 PCP: Wayland Salinas, MD   Chief Complaint  Patient presents with   Abdominal Pain    Nausea,     Brief Narrative:  Patient is a pleasant 65 year old female history of diverticulosis presenting to the ED with a 1 to 2-week history of worsening cramping lower abdominal pain.  Patient noted to have episode of nausea and 2 episodes of bilious emesis on the morning of admission.  CT abdomen and pelvis done concerning for sigmoid diverticulitis with microperforation, ileus.  Patient admitted, placed on bowel rest, empiric IV antibiotics, IV pain medication, IV antiemetics.  Supportive care.  General surgery consulted and following. Persistent abdominal pain, and repeat CT shows multiple abscesses. IR consulted and she underwent 10 Fr drain placed in pelvic abscess via right transgluteal approach with return of serosanguinous fluid. Fluid sample sent for culture analysis. Drain attached to suction bulb drainage.     Assessment & Plan:   Principal Problem:   Sigmoid diverticulitis Active Problems:   Ileus ([place])   Dehydration   Acute sigmoid diverticulitis with microperforation, with resultant multiple abscesses on repeat CT abdomen and pelvis.  Gen surgery consulted, await recommendations.  Continue with IV fluids and IV zosyn. Conservative therapies were unsuccessful.  Pain control with IV morphine and Oral tramadol IR consulted and she underwent 10 Fr drain placed in pelvic abscess via right transgluteal approach with return of serosanguinous fluid. Fluid sample sent for culture analysis. Drain attached to suction bulb drainage. Started her on clears. Wbc count wnl today.    Papular rash on the back probably contact dermatitis.  Hydrocortisone cream prn.   Dehydration improved.   Hypokalemia:  Replaced.  Repeat BMP in am.    Mild anemia of acute illness  Hemoglobin around 11.    DVT  prophylaxis: (Lovenox) Code Status: (Full code. ) Family Communication: none at bedside.  Disposition:   Status is: Inpatient  Remains inpatient appropriate because: IV antibiotics.        Consultants:  Surgery.  Procedures: none.   Antimicrobials:  Antibiotics Given (last 72 hours)     Date/Time Action Medication Dose Rate   09/01/21 2109 New Bag/Given   piperacillin-tazobactam (ZOSYN) IVPB 3.375 g 3.375 g 12.5 mL/hr   09/02/21 0526 New Bag/Given   piperacillin-tazobactam (ZOSYN) IVPB 3.375 g 3.375 g 12.5 mL/hr   09/02/21 1546 New Bag/Given   piperacillin-tazobactam (ZOSYN) IVPB 3.375 g 3.375 g 12.5 mL/hr   09/02/21 2211 New Bag/Given   piperacillin-tazobactam (ZOSYN) IVPB 3.375 g 3.375 g 12.5 mL/hr   09/03/21 0549 New Bag/Given   piperacillin-tazobactam (ZOSYN) IVPB 3.375 g 3.375 g 12.5 mL/hr   09/03/21 1429 New Bag/Given   piperacillin-tazobactam (ZOSYN) IVPB 3.375 g 3.375 g 12.5 mL/hr   09/03/21 2230 New Bag/Given   piperacillin-tazobactam (ZOSYN) IVPB 3.375 g 3.375 g 12.5 mL/hr   09/04/21 0523 New Bag/Given   piperacillin-tazobactam (ZOSYN) IVPB 3.375 g 3.375 g 12.5 mL/hr   09/04/21 1336 New Bag/Given   piperacillin-tazobactam (ZOSYN) IVPB 3.375 g 3.375 g 12.5 mL/hr         Subjective: Abd pain improving. No nausea, vomiting.    Objective: Vitals:   09/04/21 0925 09/04/21 0934 09/04/21 1248 09/04/21 1330  BP: 113/64  137/73 126/66  Pulse: 62  (!) 59 65  Resp: 12  15 16   Temp:   98.6 F (37 C) (!) 97.4 F (36.3 C)  TempSrc:   Oral Oral  SpO2: 99%  94% 95% 98%  Weight:      Height:        Intake/Output Summary (Last 24 hours) at 09/04/2021 1641 Last data filed at 09/04/2021 1300 Gross per 24 hour  Intake 340 ml  Output 75 ml  Net 265 ml    Filed Weights   09/01/21 0500 09/02/21 0533 09/03/21 0545  Weight: 74.3 kg 81 kg 79.8 kg    Examination:  General exam: Appears calm and comfortable  Respiratory system: Clear to auscultation.  Respiratory effort normal. Cardiovascular system: S1 & S2 heard, RRR. No JVD, No pedal edema. Gastrointestinal system: Abdomen is soft, distended, mildly tender.  Normal bowel sounds heard. Central nervous system: Alert and oriented. No focal neurological deficits. Extremities: Symmetric 5 x 5 power. Skin: No rashes, lesions or ulcers Psychiatry: Mood & affect appropriate.       Data Reviewed: I have personally reviewed following labs and imaging studies  CBC: Recent Labs  Lab 08/29/21 1124 08/30/21 0352 08/31/21 0341 09/01/21 0340 09/02/21 0345 09/03/21 0332 09/04/21 0329  WBC 14.4* 13.7* 13.1* 12.2* 9.9 11.2* 10.0  NEUTROABS 13.1* 11.7* 10.9*  --   --   --   --   HGB 14.3 12.6 11.3* 11.4* 10.9* 10.8* 11.1*  HCT 42.4 37.3 34.3* 34.2* 32.5* 32.6* 33.4*  MCV 93.8 94.7 96.9 95.3 93.7 93.9 93.6  PLT 252 220 195 198 215 245 275     Basic Metabolic Panel: Recent Labs  Lab 08/30/21 0352 08/31/21 0341 09/01/21 0340 09/02/21 0345 09/03/21 0332 09/03/21 0630 09/04/21 0329  NA 141 138 138 139 138  --  138  K 4.0 3.9 3.8 3.5 3.2*  --  3.4*  CL 106 105 107 108 104  --  102  CO2 29 26 23 25 26   --  27  GLUCOSE 111* 75 81 96 101*  --  101*  BUN 17 18 10  <5* <5*  --  5*  CREATININE 0.85 0.90 0.78 0.65 0.77  --  0.68  CALCIUM 8.4* 7.9* 7.8* 8.0* 8.0*  --  8.1*  MG 2.1  --  2.0  --   --  2.0  --   PHOS  --   --   --   --  2.7  --   --      GFR: Estimated Creatinine Clearance: 79.9 mL/min (by C-G formula based on SCr of 0.68 mg/dL).  Liver Function Tests: Recent Labs  Lab 08/29/21 1124 08/30/21 0352  AST 17 13*  ALT 16 14  ALKPHOS 49 39  BILITOT 0.6 0.9  PROT 7.4 6.3*  ALBUMIN 3.9 3.2*     CBG: No results for input(s): GLUCAP in the last 168 hours.   Recent Results (from the past 240 hour(s))  Resp Panel by RT-PCR (Flu A&B, Covid) Nasopharyngeal Swab     Status: None   Collection Time: 08/29/21 11:24 AM   Specimen: Nasopharyngeal Swab; Nasopharyngeal(NP)  swabs in vial transport medium  Result Value Ref Range Status   SARS Coronavirus 2 by RT PCR NEGATIVE NEGATIVE Final    Comment: (NOTE) SARS-CoV-2 target nucleic acids are NOT DETECTED.  The SARS-CoV-2 RNA is generally detectable in upper respiratory specimens during the acute phase of infection. The lowest concentration of SARS-CoV-2 viral copies this assay can detect is 138 copies/mL. A negative result does not preclude SARS-Cov-2 infection and should not be used as the sole basis for treatment or other patient management decisions. A negative result may occur with  improper specimen collection/handling,  submission of specimen other than nasopharyngeal swab, presence of viral mutation(s) within the areas targeted by this assay, and inadequate number of viral copies(<138 copies/mL). A negative result must be combined with clinical observations, patient history, and epidemiological information. The expected result is Negative.  Fact Sheet for Patients:  EntrepreneurPulse.com.au  Fact Sheet for Healthcare Providers:  IncredibleEmployment.be  This test is no t yet approved or cleared by the [place] FDA and  has been authorized for detection and/or diagnosis of SARS-CoV-2 by FDA under an Emergency Use Authorization (EUA). This EUA will remain  in effect (meaning this test can be used) for the duration of the COVID-19 declaration under Section 564(b)(1) of the Act, 21 U.S.C.section 360bbb-3(b)(1), unless the authorization is terminated  or revoked sooner.       Influenza A by PCR NEGATIVE NEGATIVE Final   Influenza B by PCR NEGATIVE NEGATIVE Final    Comment: (NOTE) The Xpert Xpress SARS-CoV-2/FLU/RSV plus assay is intended as an aid in the diagnosis of influenza from Nasopharyngeal swab specimens and should not be used as a sole basis for treatment. Nasal washings and aspirates are unacceptable for Xpert Xpress  SARS-CoV-2/FLU/RSV testing.  Fact Sheet for Patients: EntrepreneurPulse.com.au  Fact Sheet for Healthcare Providers: IncredibleEmployment.be  This test is not yet approved or cleared by the [place] FDA and has been authorized for detection and/or diagnosis of SARS-CoV-2 by FDA under an Emergency Use Authorization (EUA). This EUA will remain in effect (meaning this test can be used) for the duration of the COVID-19 declaration under Section 564(b)(1) of the Act, 21 U.S.C. section 360bbb-3(b)(1), unless the authorization is terminated or revoked.  Performed at [facility], [address] [place], [place] [address]   Aerobic/Anaerobic Culture w Gram Stain (surgical/deep wound)     Status: None (Preliminary result)   Collection Time: 09/04/21  9:59 AM   Specimen: Abscess  Result Value Ref Range Status   Specimen Description   Final    ABSCESS Performed at [facility] [address] [place], [place] [address]    Special Requests   Final    Normal Performed at [facility], [place] [address] [place], [place] [address]    Gram Stain   Final    RARE WBC PRESENT,BOTH PMN AND MONONUCLEAR NO ORGANISMS SEEN Performed at [facility], [place] [address], [place], [place] [address]    Culture PENDING  Incomplete   Report Status PENDING  Incomplete          Radiology Studies: CT ABDOMEN PELVIS W CONTRAST  Result Date: 09/03/2021 CLINICAL DATA:  Follow-up sigmoid diverticulitis. Patient with increasing pain, fever and rising white blood cell count. EXAM: CT ABDOMEN AND PELVIS WITH CONTRAST TECHNIQUE: Multidetector CT imaging of the abdomen and pelvis was performed using the standard protocol following bolus administration of intravenous contrast. CONTRAST:  4mL OMNIPAQUE IOHEXOL 350 MG/ML SOLN COMPARISON:  CT scan 08/29/2021 FINDINGS: Lower chest: New small bilateral pleural effusions and  overlying bibasilar atelectasis. Hepatobiliary: No hepatic lesions or intrahepatic biliary dilatation. The gallbladder is unremarkable. No common bile duct dilatation. Pancreas: No mass, inflammation or ductal dilatation. Spleen: Normal size.  No focal lesions. Adrenals/Urinary Tract: Adrenal glands and kidneys are unremarkable and stable. Stable left-sided parapelvic renal cyst. The bladder is grossly. Stomach/Bowel: The stomach, duodenum and small bowel are unremarkable. No inflammatory changes or obstructive findings. The terminal ileum is normal. The appendix is. Persistent changes of acute diverticulitis involving the mid  to lower sigmoid colon. There is now a more clearly defined intramural abscess measuring 2.8 x 2.3 cm. There are also multiple pelvic abscesses, new since the prior study. The largest abscess is in the cul-de-sac region and measures 7.6 x 5.5 cm. Anterior to the uterus there is a smaller abscess measuring 4.3 cm. This may communicate with the cul-de-sac abscess when seen on the sagittal images (image 65/7). In the left adnexal region there is a small abscess measuring 2.3 cm. Vascular/Lymphatic: Stable aortic and iliac artery calcifications. No aneurysm or dissection. The branch vessels are patent. The major venous structures are patent. No mesenteric or retroperitoneal mass or adenopathy. There are small likely reactive pelvic sidewall nodes Reproductive: The uterus and ovaries are surrounded by inflammation abscesses. Other: No inguinal mass or hernia.  No subcutaneous lesions. Musculoskeletal: No significant bony findings. IMPRESSION: 1. Persistent changes of acute diverticulitis involving the mid to lower sigmoid colon. 2. Multiple new pelvic abscesses as detailed above. 3. New small bilateral pleural effusions and overlying bibasilar atelectasis. 4. Aortic atherosclerosis. These results will be called to the ordering clinician or representative by the Radiologist Assistant, and  communication documented in the PACS or [employer]. Aortic Atherosclerosis (ICD10-I70.0). Electronically Signed   By: Marijo Sanes M.D.   On: 09/03/2021 14:20   CT IMAGE GUIDED DRAINAGE BY PERCUTANEOUS CATHETER  Result Date: 09/04/2021 CLINICAL DATA:  Sigmoid diverticulitis and development of multiple pelvic abscess fluid collections including a large posterior deep pelvic abscess. EXAM: CT GUIDED CATHETER DRAINAGE OF PELVIC PERITONEAL ABSCESS ANESTHESIA/SEDATION: Moderate (conscious) sedation was employed during this procedure. A total of Versed 2.0 mg and Fentanyl 125 mcg was administered intravenously by radiology nursing under my supervision. Moderate Sedation Time: 22 minutes. The patient's level of consciousness and vital signs were monitored continuously by radiology nursing throughout the procedure under my direct supervision. PROCEDURE: The procedure, risks, benefits, and alternatives were explained to the patient. Questions regarding the procedure were encouraged and answered. The patient understands and consents to the procedure. A time out was performed prior to initiating the procedure. CT was performed in a prone position to localize a posterior pelvic abscess. The right gluteal region was prepped with chlorhexidine in a sterile fashion, and a sterile drape was applied covering the operative field. A sterile gown and sterile gloves were used for the procedure. Local anesthesia was provided with 1% Lidocaine. An 18 gauge trocar needle was advanced under CT guidance via a right posterior transgluteal approach into a posterior pelvic abscess fluid collection. After confirming needle tip position, the needle was removed over a guidewire. The percutaneous tract was dilated and a 10 French percutaneous drain placed. A fluid sample was withdrawn and sent for culture analysis. CT was performed to confirm catheter position. The drain was attached to suction bulb drainage and secured at the skin with  a Prolene retention suture and StatLock device. COMPLICATIONS: None FINDINGS: After placement of a drainage catheter into the dominant posterior pelvic abscess, there was return thin, slightly turbid serosanguineous fluid. A sample was sent for culture analysis. IMPRESSION: CT-guided percutaneous catheter drainage of posterior pelvic abscess via a right transgluteal approach. There was return of thin, slightly turbid serosanguineous fluid. A sample was sent for culture analysis. A 10 French drain was placed and attached to suction bulb drainage. Electronically Signed   By: Aletta Edouard M.D.   On: 09/04/2021 10:34        Scheduled Meds:  enoxaparin (LOVENOX) injection  40 mg Subcutaneous Q24H  feeding supplement  237 mL Oral BID BM   hydrocortisone cream   Topical QID   senna-docusate  1 tablet Oral BID   sodium chloride flush  10 mL Intracatheter Q8H   sodium chloride flush  3 mL Intravenous Q12H   Continuous Infusions:  sodium chloride     piperacillin-tazobactam (ZOSYN)  IV 3.375 g (09/04/21 1336)     LOS: 6 days        Hosie Poisson, MD [facility]   To contact the attending provider between 7A-7P or the covering provider during after hours 7P-7A, please log into the web site [email or web address] and access using universal [place] password for that web site. If you do not have the password, please call the hospital operator.  09/04/2021, 4:41 PM

## 2021-09-04 NOTE — Progress Notes (Signed)
Patient back from IR . 10 French drain placed on right buttocks to a JP-drain. Bulb Compressed. Patient alert and oriented x 4. Pt resting in bed. Will continue to monitor.

## 2021-09-04 NOTE — Progress Notes (Signed)
Subjective: CC: Patient reports less pain in her lower abdomen overnight. No n/v. Going down for IR drainage this AM.  Objective: Vital signs in last 24 hours: Temp:  [97.9 F (36.6 C)-99.3 F (37.4 C)] 99.3 F (37.4 C) (11/18 0603) Pulse Rate:  [63-72] 63 (11/18 0603) Resp:  [16-20] 16 (11/18 0603) BP: (121-142)/(63-78) 121/63 (11/18 0603) SpO2:  [96 %-100 %] 96 % (11/18 0603) Last BM Date: 09/01/21  Intake/Output from previous day: 11/17 0701 - 11/18 0700 In: 273.2 [P.O.:120; IV Piggyback:153.2] Out: -  Intake/Output this shift: No intake/output data recorded.  PE: Gen:  Alert, NAD, pleasant Pulm:  Normal rate and effort Abd: Soft, mild distension, tender in the LLQ>suprapubic tenderness which is similar to yesterday. No rigidity or guarding. + bowel sounds Psych: A&Ox3    Lab Results:  Recent Labs    09/03/21 0332 09/04/21 0329  WBC 11.2* 10.0  HGB 10.8* 11.1*  HCT 32.6* 33.4*  PLT 245 275   BMET Recent Labs    09/03/21 0332 09/04/21 0329  NA 138 138  K 3.2* 3.4*  CL 104 102  CO2 26 27  GLUCOSE 101* 101*  BUN <5* 5*  CREATININE 0.77 0.68  CALCIUM 8.0* 8.1*   PT/INR No results for input(s): LABPROT, INR in the last 72 hours. CMP     Component Value Date/Time   NA 138 09/04/2021 0329   K 3.4 (L) 09/04/2021 0329   CL 102 09/04/2021 0329   CO2 27 09/04/2021 0329   GLUCOSE 101 (H) 09/04/2021 0329   BUN 5 (L) 09/04/2021 0329   CREATININE 0.68 09/04/2021 0329   CALCIUM 8.1 (L) 09/04/2021 0329   PROT 6.3 (L) 08/30/2021 0352   ALBUMIN 3.2 (L) 08/30/2021 0352   AST 13 (L) 08/30/2021 0352   ALT 14 08/30/2021 0352   ALKPHOS 39 08/30/2021 0352   BILITOT 0.9 08/30/2021 0352   GFRNONAA >60 09/04/2021 0329   Lipase     Component Value Date/Time   LIPASE 25 08/29/2021 1124    Studies/Results: CT ABDOMEN PELVIS W CONTRAST  Result Date: 09/03/2021 CLINICAL DATA:  Follow-up sigmoid diverticulitis. Patient with increasing pain, fever and  rising white blood cell count. EXAM: CT ABDOMEN AND PELVIS WITH CONTRAST TECHNIQUE: Multidetector CT imaging of the abdomen and pelvis was performed using the standard protocol following bolus administration of intravenous contrast. CONTRAST:  25mL OMNIPAQUE IOHEXOL 350 MG/ML SOLN COMPARISON:  CT scan 08/29/2021 FINDINGS: Lower chest: New small bilateral pleural effusions and overlying bibasilar atelectasis. Hepatobiliary: No hepatic lesions or intrahepatic biliary dilatation. The gallbladder is unremarkable. No common bile duct dilatation. Pancreas: No mass, inflammation or ductal dilatation. Spleen: Normal size.  No focal lesions. Adrenals/Urinary Tract: Adrenal glands and kidneys are unremarkable and stable. Stable left-sided parapelvic renal cyst. The bladder is grossly. Stomach/Bowel: The stomach, duodenum and small bowel are unremarkable. No inflammatory changes or obstructive findings. The terminal ileum is normal. The appendix is. Persistent changes of acute diverticulitis involving the mid to lower sigmoid colon. There is now a more clearly defined intramural abscess measuring 2.8 x 2.3 cm. There are also multiple pelvic abscesses, new since the prior study. The largest abscess is in the cul-de-sac region and measures 7.6 x 5.5 cm. Anterior to the uterus there is a smaller abscess measuring 4.3 cm. This may communicate with the cul-de-sac abscess when seen on the sagittal images (image 65/7). In the left adnexal region there is a small abscess measuring 2.3 cm. Vascular/Lymphatic: Stable  aortic and iliac artery calcifications. No aneurysm or dissection. The branch vessels are patent. The major venous structures are patent. No mesenteric or retroperitoneal mass or adenopathy. There are small likely reactive pelvic sidewall nodes Reproductive: The uterus and ovaries are surrounded by inflammation abscesses. Other: No inguinal mass or hernia.  No subcutaneous lesions. Musculoskeletal: No significant bony  findings. IMPRESSION: 1. Persistent changes of acute diverticulitis involving the mid to lower sigmoid colon. 2. Multiple new pelvic abscesses as detailed above. 3. New small bilateral pleural effusions and overlying bibasilar atelectasis. 4. Aortic atherosclerosis. These results will be called to the ordering clinician or representative by the Radiologist Assistant, and communication documented in the PACS or [employer]. Aortic Atherosclerosis (ICD10-I70.0). Electronically Signed   By: Marijo Sanes M.D.   On: 09/03/2021 14:20    Anti-infectives: Anti-infectives (From admission, onward)    Start     Dose/Rate Route Frequency Ordered Stop   08/29/21 2200  piperacillin-tazobactam (ZOSYN) IVPB 3.375 g  Status:  Discontinued        3.375 g 100 mL/hr over 30 Minutes Intravenous Every 8 hours 08/29/21 1831 08/29/21 1833   08/29/21 2200  piperacillin-tazobactam (ZOSYN) IVPB 3.375 g        3.375 g 12.5 mL/hr over 240 Minutes Intravenous Every 8 hours 08/29/21 1833     08/29/21 1315  piperacillin-tazobactam (ZOSYN) IVPB 3.375 g        3.375 g 100 mL/hr over 30 Minutes Intravenous  Once 08/29/21 1307 08/29/21 1359        Assessment/Plan Sigmoid diverticulitis with microperforation and abscess - Repeat CT 11/17 w/ multiple new pelvic abscesses. IR to drain today - Cont IV abx  - Hopefully will improve with conservative therapies. If she was to fail to improve with conservative therapy she may require surgery that would likely result in a colectomy and colostomy.    FEN - NPO for IR procedure, IVF per [facility] VTE - SCDs, Lovenox ID - Zosyn   LOS: 6 days    Jillyn Ledger , [facility] Surgery 09/04/2021, 8:53 AM Please see Amion for pager number during day hours 7:00am-4:30pm

## 2021-09-04 NOTE — Procedures (Signed)
Interventional Radiology Procedure Note  Procedure: CT Guided Drainage of pelvic abscess  Complications: None  Estimated Blood Loss: < 10 mL  Findings: 10 Fr drain placed in pelvic abscess via right transgluteal approach with return of serosanguinous fluid. Fluid sample sent for culture analysis. Drain attached to suction bulb drainage.  Will follow.  Venetia Night. Kathlene Cote, M.D Pager:  [phone]

## 2021-09-05 LAB — BASIC METABOLIC PANEL
Anion gap: 8 (ref 5–15)
BUN: 6 mg/dL — ABNORMAL LOW (ref 8–23)
CO2: 29 mmol/L (ref 22–32)
Calcium: 8.6 mg/dL — ABNORMAL LOW (ref 8.9–10.3)
Chloride: 102 mmol/L (ref 98–111)
Creatinine, Ser: 0.83 mg/dL (ref 0.44–1.00)
GFR, Estimated: >60 mL/min (ref >60)
Glucose, Bld: 117 mg/dL — ABNORMAL HIGH (ref 70–99)
Potassium: 3.4 mmol/L — ABNORMAL LOW (ref 3.5–5.1)
Sodium: 139 mmol/L (ref 135–145)

## 2021-09-05 NOTE — Progress Notes (Signed)
PROGRESS NOTE    Jaclyn York  IOX:[id number] DOB: 1955-10-21 DOA: 08/29/2021 PCP: Wayland Salinas, MD   Chief Complaint  Patient presents with   Abdominal Pain    Nausea,     Brief Narrative:  Patient is a pleasant 65 year old female history of diverticulosis presenting to the ED with a 1 to 2-week history of worsening cramping lower abdominal pain.  Patient noted to have episode of nausea and 2 episodes of bilious emesis on the morning of admission.  CT abdomen and pelvis done concerning for sigmoid diverticulitis with microperforation, ileus.  Patient admitted, placed on bowel rest, empiric IV antibiotics, IV pain medication, IV antiemetics.  Supportive care.  General surgery consulted and following. Persistent abdominal pain, and repeat CT shows multiple abscesses. IR consulted and she underwent 10 Fr drain placed in pelvic abscess via right transgluteal approach with return of serosanguinous fluid. Fluid sample sent for culture analysis. Drain attached to suction bulb drainage.     Assessment & Plan:   Principal Problem:   Sigmoid diverticulitis Active Problems:   Ileus ([place])   Dehydration   Acute sigmoid diverticulitis with microperforation, with multiple  pelvic abscesses on repeat CT abdomen and pelvis.  Gen surgery on board and following the patient.  Continue with IV fluids and IV zosyn.  Pain control with IV morphine and Oral tramadol IR consulted and she underwent 10 Fr drain placed in pelvic abscess via right transgluteal approach with return of  serosanguinous fluid. Fluid sample sent for culture analysis. Drain attached to suction bulb drainage. Started her on clears, abel to tolerate with simethicone. Wbc count wnl .  Continue to monitor.     Erythematous Papular rash on the back probably contact dermatitis.  Hydrocortisone cream prn.   Dehydration improved.   Hypokalemia:  Replaced.  Repeat BMP pending today.    Mild anemia of acute illness   Hemoglobin around 11.    DVT prophylaxis: (Lovenox) Code Status: (Full code. ) Family Communication: none at bedside.  Disposition:   Status is: Inpatient  Remains inpatient appropriate because: IV antibiotics.        Consultants:  Surgery.  IR Procedures: 10 Fr drain placed in pelvic abscess via right transgluteal approach by IR   Antimicrobials:  Antibiotics Given (last 72 hours)     Date/Time Action Medication Dose Rate   09/02/21 2211 New Bag/Given   piperacillin-tazobactam (ZOSYN) IVPB 3.375 g 3.375 g 12.5 mL/hr   09/03/21 0549 New Bag/Given   piperacillin-tazobactam (ZOSYN) IVPB 3.375 g 3.375 g 12.5 mL/hr   09/03/21 1429 New Bag/Given   piperacillin-tazobactam (ZOSYN) IVPB 3.375 g 3.375 g 12.5 mL/hr   09/03/21 2230 New Bag/Given   piperacillin-tazobactam (ZOSYN) IVPB 3.375 g 3.375 g 12.5 mL/hr   09/04/21 0523 New Bag/Given   piperacillin-tazobactam (ZOSYN) IVPB 3.375 g 3.375 g 12.5 mL/hr   09/04/21 1336 New Bag/Given   piperacillin-tazobactam (ZOSYN) IVPB 3.375 g 3.375 g 12.5 mL/hr   09/04/21 2114 New Bag/Given   piperacillin-tazobactam (ZOSYN) IVPB 3.375 g 3.375 g 12.5 mL/hr   09/05/21 2683 New Bag/Given   piperacillin-tazobactam (ZOSYN) IVPB 3.375 g 3.375 g 12.5 mL/hr   09/05/21 1234 New Bag/Given   piperacillin-tazobactam (ZOSYN) IVPB 3.375 g 3.375 g 12.5 mL/hr         Subjective: No nausea, or vomiting. Abd pain is improving, but not resolved yet.    Objective: Vitals:   09/04/21 1330 09/04/21 1643 09/05/21 0500 09/05/21 1326  BP: 126/66 112/62  118/71  Pulse: 65 (!)  57  (!) 59  Resp: 16 15    Temp: (!) 97.4 F (36.3 C) 98.3 F (36.8 C)  98.3 F (36.8 C)  TempSrc: Oral Oral  Oral  SpO2: 98% 97%  99%  Weight:   81.8 kg   Height:        Intake/Output Summary (Last 24 hours) at 09/05/2021 1619 Last data filed at 09/05/2021 8527 Gross per 24 hour  Intake 667.24 ml  Output 70 ml  Net 597.24 ml    Filed Weights   09/02/21 0533  09/03/21 0545 09/05/21 0500  Weight: 81 kg 79.8 kg 81.8 kg    Examination:  General exam: Appears calm and comfortable  Respiratory system: Clear to auscultation. Respiratory effort normal. Cardiovascular system: S1 & S2 heard, RRR. No JVD, murmurs, rubs, gallops or clicks. No pedal edema. Gastrointestinal system: Abdomen is soft, tender mildly distended, , minimal bowel sounds. Pelvic drain present.  Central nervous system: Alert and oriented. No focal neurological deficits. Extremities: Symmetric 5 x 5 power. Skin: No rashes, lesions or ulcers Psychiatry: Judgement and insight appear normal. Mood & affect appropriate.        Data Reviewed: I have personally reviewed following labs and imaging studies  CBC: Recent Labs  Lab 08/30/21 0352 08/31/21 0341 09/01/21 0340 09/02/21 0345 09/03/21 0332 09/04/21 0329  WBC 13.7* 13.1* 12.2* 9.9 11.2* 10.0  NEUTROABS 11.7* 10.9*  --   --   --   --   HGB 12.6 11.3* 11.4* 10.9* 10.8* 11.1*  HCT 37.3 34.3* 34.2* 32.5* 32.6* 33.4*  MCV 94.7 96.9 95.3 93.7 93.9 93.6  PLT 220 195 198 215 245 275     Basic Metabolic Panel: Recent Labs  Lab 08/30/21 0352 08/31/21 0341 09/01/21 0340 09/02/21 0345 09/03/21 0332 09/03/21 0630 09/04/21 0329  NA 141 138 138 139 138  --  138  K 4.0 3.9 3.8 3.5 3.2*  --  3.4*  CL 106 105 107 108 104  --  102  CO2 29 26 23 25 26   --  27  GLUCOSE 111* 75 81 96 101*  --  101*  BUN 17 18 10  <5* <5*  --  5*  CREATININE 0.85 0.90 0.78 0.65 0.77  --  0.68  CALCIUM 8.4* 7.9* 7.8* 8.0* 8.0*  --  8.1*  MG 2.1  --  2.0  --   --  2.0  --   PHOS  --   --   --   --  2.7  --   --      GFR: Estimated Creatinine Clearance: 80.8 mL/min (by C-G formula based on SCr of 0.68 mg/dL).  Liver Function Tests: Recent Labs  Lab 08/30/21 0352  AST 13*  ALT 14  ALKPHOS 39  BILITOT 0.9  PROT 6.3*  ALBUMIN 3.2*     CBG: No results for input(s): GLUCAP in the last 168 hours.   Recent Results (from the past  240 hour(s))  Resp Panel by RT-PCR (Flu A&B, Covid) Nasopharyngeal Swab     Status: None   Collection Time: 08/29/21 11:24 AM   Specimen: Nasopharyngeal Swab; Nasopharyngeal(NP) swabs in vial transport medium  Result Value Ref Range Status   SARS Coronavirus 2 by RT PCR NEGATIVE NEGATIVE Final    Comment: (NOTE) SARS-CoV-2 target nucleic acids are NOT DETECTED.  The SARS-CoV-2 RNA is generally detectable in upper respiratory specimens during the acute phase of infection. The lowest concentration of SARS-CoV-2 viral copies this assay can detect is 138 copies/mL.  A negative result does not preclude SARS-Cov-2 infection and should not be used as the sole basis for treatment or other patient management decisions. A negative result may occur with  improper specimen collection/handling, submission of specimen other than nasopharyngeal swab, presence of viral mutation(s) within the areas targeted by this assay, and inadequate number of viral copies(<138 copies/mL). A negative result must be combined with clinical observations, patient history, and epidemiological information. The expected result is Negative.  Fact Sheet for Patients:  EntrepreneurPulse.com.au  Fact Sheet for Healthcare Providers:  IncredibleEmployment.be  This test is no t yet approved or cleared by the [place] FDA and  has been authorized for detection and/or diagnosis of SARS-CoV-2 by FDA under an Emergency Use Authorization (EUA). This EUA will remain  in effect (meaning this test can be used) for the duration of the COVID-19 declaration under Section 564(b)(1) of the Act, 21 U.S.C.section 360bbb-3(b)(1), unless the authorization is terminated  or revoked sooner.       Influenza A by PCR NEGATIVE NEGATIVE Final   Influenza B by PCR NEGATIVE NEGATIVE Final    Comment: (NOTE) The Xpert Xpress SARS-CoV-2/FLU/RSV plus assay is intended as an aid in the diagnosis of influenza  from Nasopharyngeal swab specimens and should not be used as a sole basis for treatment. Nasal washings and aspirates are unacceptable for Xpert Xpress SARS-CoV-2/FLU/RSV testing.  Fact Sheet for Patients: EntrepreneurPulse.com.au  Fact Sheet for Healthcare Providers: IncredibleEmployment.be  This test is not yet approved or cleared by the [place] FDA and has been authorized for detection and/or diagnosis of SARS-CoV-2 by FDA under an Emergency Use Authorization (EUA). This EUA will remain in effect (meaning this test can be used) for the duration of the COVID-19 declaration under Section 564(b)(1) of the Act, 21 U.S.C. section 360bbb-3(b)(1), unless the authorization is terminated or revoked.  Performed at [facility], [address] [place], [place] [address]   Aerobic/Anaerobic Culture w Gram Stain (surgical/deep wound)     Status: None (Preliminary result)   Collection Time: 09/04/21  9:59 AM   Specimen: Abscess  Result Value Ref Range Status   Specimen Description   Final    ABSCESS Performed at [facility] [address] [place], [place] [address]    Special Requests   Final    Normal Performed at [facility], [place] [address] [place], [place] [address]    Gram Stain   Final    RARE WBC PRESENT,BOTH PMN AND MONONUCLEAR NO ORGANISMS SEEN    Culture   Final    NO GROWTH 1 DAY Performed at [facility], [place] [address], [place], [place] [address]    Report Status PENDING  Incomplete          Radiology Studies: CT IMAGE GUIDED DRAINAGE BY PERCUTANEOUS CATHETER  Result Date: 09/04/2021 CLINICAL DATA:  Sigmoid diverticulitis and development of multiple pelvic abscess fluid collections including a large posterior deep pelvic abscess. EXAM: CT GUIDED CATHETER DRAINAGE OF PELVIC PERITONEAL ABSCESS ANESTHESIA/SEDATION: Moderate (conscious) sedation was employed during  this procedure. A total of Versed 2.0 mg and Fentanyl 125 mcg was administered intravenously by radiology nursing under my supervision. Moderate Sedation Time: 22 minutes. The patient's level of consciousness and vital signs were monitored continuously by radiology nursing throughout the procedure under my direct supervision. PROCEDURE: The procedure, risks, benefits, and alternatives were explained to the patient. Questions regarding the procedure were encouraged and answered. The patient understands and  consents to the procedure. A time out was performed prior to initiating the procedure. CT was performed in a prone position to localize a posterior pelvic abscess. The right gluteal region was prepped with chlorhexidine in a sterile fashion, and a sterile drape was applied covering the operative field. A sterile gown and sterile gloves were used for the procedure. Local anesthesia was provided with 1% Lidocaine. An 18 gauge trocar needle was advanced under CT guidance via a right posterior transgluteal approach into a posterior pelvic abscess fluid collection. After confirming needle tip position, the needle was removed over a guidewire. The percutaneous tract was dilated and a 10 French percutaneous drain placed. A fluid sample was withdrawn and sent for culture analysis. CT was performed to confirm catheter position. The drain was attached to suction bulb drainage and secured at the skin with a Prolene retention suture and StatLock device. COMPLICATIONS: None FINDINGS: After placement of a drainage catheter into the dominant posterior pelvic abscess, there was return thin, slightly turbid serosanguineous fluid. A sample was sent for culture analysis. IMPRESSION: CT-guided percutaneous catheter drainage of posterior pelvic abscess via a right transgluteal approach. There was return of thin, slightly turbid serosanguineous fluid. A sample was sent for culture analysis. A 10 French drain was placed and attached to  suction bulb drainage. Electronically Signed   By: Aletta Edouard M.D.   On: 09/04/2021 10:34        Scheduled Meds:  enoxaparin (LOVENOX) injection  40 mg Subcutaneous Q24H   feeding supplement  237 mL Oral BID BM   hydrocortisone cream   Topical QID   senna-docusate  1 tablet Oral BID   sodium chloride flush  10 mL Intracatheter Q8H   sodium chloride flush  3 mL Intravenous Q12H   Continuous Infusions:  sodium chloride 75 mL/hr at 09/04/21 1708   piperacillin-tazobactam (ZOSYN)  IV 3.375 g (09/05/21 1234)     LOS: 7 days        Hosie Poisson, MD [facility]   To contact the attending provider between 7A-7P or the covering provider during after hours 7P-7A, please log into the web site [email or web address] and access using universal [place] password for that web site. If you do not have the password, please call the hospital operator.  09/05/2021, 4:19 PM

## 2021-09-05 NOTE — Progress Notes (Signed)
Subjective: IR drain placement went well. Abdominal pain is stable to slightly improved and tolerating clear liquids. No nausea/emesis.  Objective: Vital signs in last 24 hours: Temp:  [97.4 F (36.3 C)-98.6 F (37 C)] 98.3 F (36.8 C) (11/18 1643) Pulse Rate:  [57-65] 57 (11/18 1643) Resp:  [11-18] 15 (11/18 1643) BP: (112-137)/(58-76) 112/62 (11/18 1643) SpO2:  [94 %-100 %] 97 % (11/18 1643) Weight:  [81.8 kg] 81.8 kg (11/19 0500) Last BM Date: 09/01/21  Intake/Output from previous day: 11/18 0701 - 11/19 0700 In: 787.2 [P.O.:120; I.V.:507.2; IV Piggyback:150] Out: 145 [Drains:145] Intake/Output this shift: No intake/output data recorded.  PE: Gen:  Alert, NAD, pleasant Pulm:  Normal rate and effort Abd: Soft, mild distension, tender in the LLQ>suprapubic tenderness without rigidity or guarding. + bowel sounds. Drain with SS output MSK: bilateral lower extremities without edema or calf TTP Psych: A&Ox3    Lab Results:  Recent Labs    09/03/21 0332 09/04/21 0329  WBC 11.2* 10.0  HGB 10.8* 11.1*  HCT 32.6* 33.4*  PLT 245 275    BMET Recent Labs    09/03/21 0332 09/04/21 0329  NA 138 138  K 3.2* 3.4*  CL 104 102  CO2 26 27  GLUCOSE 101* 101*  BUN <5* 5*  CREATININE 0.77 0.68  CALCIUM 8.0* 8.1*    PT/INR No results for input(s): LABPROT, INR in the last 72 hours. CMP     Component Value Date/Time   NA 138 09/04/2021 0329   K 3.4 (L) 09/04/2021 0329   CL 102 09/04/2021 0329   CO2 27 09/04/2021 0329   GLUCOSE 101 (H) 09/04/2021 0329   BUN 5 (L) 09/04/2021 0329   CREATININE 0.68 09/04/2021 0329   CALCIUM 8.1 (L) 09/04/2021 0329   PROT 6.3 (L) 08/30/2021 0352   ALBUMIN 3.2 (L) 08/30/2021 0352   AST 13 (L) 08/30/2021 0352   ALT 14 08/30/2021 0352   ALKPHOS 39 08/30/2021 0352   BILITOT 0.9 08/30/2021 0352   GFRNONAA >60 09/04/2021 0329   Lipase     Component Value Date/Time   LIPASE 25 08/29/2021 1124    Studies/Results: CT  ABDOMEN PELVIS W CONTRAST  Result Date: 09/03/2021 CLINICAL DATA:  Follow-up sigmoid diverticulitis. Patient with increasing pain, fever and rising white blood cell count. EXAM: CT ABDOMEN AND PELVIS WITH CONTRAST TECHNIQUE: Multidetector CT imaging of the abdomen and pelvis was performed using the standard protocol following bolus administration of intravenous contrast. CONTRAST:  9mL OMNIPAQUE IOHEXOL 350 MG/ML SOLN COMPARISON:  CT scan 08/29/2021 FINDINGS: Lower chest: New small bilateral pleural effusions and overlying bibasilar atelectasis. Hepatobiliary: No hepatic lesions or intrahepatic biliary dilatation. The gallbladder is unremarkable. No common bile duct dilatation. Pancreas: No mass, inflammation or ductal dilatation. Spleen: Normal size.  No focal lesions. Adrenals/Urinary Tract: Adrenal glands and kidneys are unremarkable and stable. Stable left-sided parapelvic renal cyst. The bladder is grossly. Stomach/Bowel: The stomach, duodenum and small bowel are unremarkable. No inflammatory changes or obstructive findings. The terminal ileum is normal. The appendix is. Persistent changes of acute diverticulitis involving the mid to lower sigmoid colon. There is now a more clearly defined intramural abscess measuring 2.8 x 2.3 cm. There are also multiple pelvic abscesses, new since the prior study. The largest abscess is in the cul-de-sac region and measures 7.6 x 5.5 cm. Anterior to the uterus there is a smaller abscess measuring 4.3 cm. This may communicate with the cul-de-sac abscess when seen on the sagittal  images (image 65/7). In the left adnexal region there is a small abscess measuring 2.3 cm. Vascular/Lymphatic: Stable aortic and iliac artery calcifications. No aneurysm or dissection. The branch vessels are patent. The major venous structures are patent. No mesenteric or retroperitoneal mass or adenopathy. There are small likely reactive pelvic sidewall nodes Reproductive: The uterus and ovaries  are surrounded by inflammation abscesses. Other: No inguinal mass or hernia.  No subcutaneous lesions. Musculoskeletal: No significant bony findings. IMPRESSION: 1. Persistent changes of acute diverticulitis involving the mid to lower sigmoid colon. 2. Multiple new pelvic abscesses as detailed above. 3. New small bilateral pleural effusions and overlying bibasilar atelectasis. 4. Aortic atherosclerosis. These results will be called to the ordering clinician or representative by the Radiologist Assistant, and communication documented in the PACS or [employer]. Aortic Atherosclerosis (ICD10-I70.0). Electronically Signed   By: Marijo Sanes M.D.   On: 09/03/2021 14:20   CT IMAGE GUIDED DRAINAGE BY PERCUTANEOUS CATHETER  Result Date: 09/04/2021 CLINICAL DATA:  Sigmoid diverticulitis and development of multiple pelvic abscess fluid collections including a large posterior deep pelvic abscess. EXAM: CT GUIDED CATHETER DRAINAGE OF PELVIC PERITONEAL ABSCESS ANESTHESIA/SEDATION: Moderate (conscious) sedation was employed during this procedure. A total of Versed 2.0 mg and Fentanyl 125 mcg was administered intravenously by radiology nursing under my supervision. Moderate Sedation Time: 22 minutes. The patient's level of consciousness and vital signs were monitored continuously by radiology nursing throughout the procedure under my direct supervision. PROCEDURE: The procedure, risks, benefits, and alternatives were explained to the patient. Questions regarding the procedure were encouraged and answered. The patient understands and consents to the procedure. A time out was performed prior to initiating the procedure. CT was performed in a prone position to localize a posterior pelvic abscess. The right gluteal region was prepped with chlorhexidine in a sterile fashion, and a sterile drape was applied covering the operative field. A sterile gown and sterile gloves were used for the procedure. Local anesthesia was  provided with 1% Lidocaine. An 18 gauge trocar needle was advanced under CT guidance via a right posterior transgluteal approach into a posterior pelvic abscess fluid collection. After confirming needle tip position, the needle was removed over a guidewire. The percutaneous tract was dilated and a 10 French percutaneous drain placed. A fluid sample was withdrawn and sent for culture analysis. CT was performed to confirm catheter position. The drain was attached to suction bulb drainage and secured at the skin with a Prolene retention suture and StatLock device. COMPLICATIONS: None FINDINGS: After placement of a drainage catheter into the dominant posterior pelvic abscess, there was return thin, slightly turbid serosanguineous fluid. A sample was sent for culture analysis. IMPRESSION: CT-guided percutaneous catheter drainage of posterior pelvic abscess via a right transgluteal approach. There was return of thin, slightly turbid serosanguineous fluid. A sample was sent for culture analysis. A 10 French drain was placed and attached to suction bulb drainage. Electronically Signed   By: Aletta Edouard M.D.   On: 09/04/2021 10:34    Anti-infectives: Anti-infectives (From admission, onward)    Start     Dose/Rate Route Frequency Ordered Stop   08/29/21 2200  piperacillin-tazobactam (ZOSYN) IVPB 3.375 g  Status:  Discontinued        3.375 g 100 mL/hr over 30 Minutes Intravenous Every 8 hours 08/29/21 1831 08/29/21 1833   08/29/21 2200  piperacillin-tazobactam (ZOSYN) IVPB 3.375 g        3.375 g 12.5 mL/hr over 240 Minutes Intravenous Every 8 hours 08/29/21  1833     08/29/21 1315  piperacillin-tazobactam (ZOSYN) IVPB 3.375 g        3.375 g 100 mL/hr over 30 Minutes Intravenous  Once 08/29/21 1307 08/29/21 1359        Assessment/Plan Sigmoid diverticulitis with microperforation and abscess - Repeat CT 11/17 w/ multiple new pelvic abscesses. - s/p IR drain 11/18. Monitor output. Culture pending - Cont  IV abx  - tolerating CLD. Advance to full liquids - Hopefully will improve with conservative therapies. If she was to fail to improve with conservative therapy she may require surgery that would likely result in a colectomy and colostomy.    FEN - FLD, ensure, IVF per TR[facility] VTE - SCDs, Lovenox ID - Zosyn   LOS: 7 days    Jaclyn York , [facility] Surgery 09/05/2021, 7:49 AM Please see Amion for pager number during day hours 7:00am-4:30pm

## 2021-09-05 NOTE — Progress Notes (Signed)
Referring Physician(s): Evette Cristal PA   Supervising Physician: Corrie Mckusick  Patient Status:  [facility] - In-pt  Chief Complaint: Sigmoid diverticulitis Found to have leukocytosis with multiple pelvic abscesses.  IR placed a 10 Fr abscess drain into the posterior pelvic abscess via a right trans gluteal approach by Dr. Kathlene Cote  Subjective:  Patient sitting up in bed. States that she is feeling better. No pain from drain.   Allergies: Patient has no known allergies.  Medications: Prior to Admission medications   Medication Sig Start Date End Date Taking? Authorizing Provider  estradiol (VIVELLE-DOT) 0.025 MG/24 HR Place 1 patch onto the skin 2 (two) times a week. Monday and Thursday 08/14/21  Yes [provider]  levocetirizine (XYZAL) 5 MG tablet Take 5 mg by mouth every evening.   Yes [provider]  progesterone (PROMETRIUM) 100 MG capsule Take 100 mg by mouth at bedtime. 07/22/21  Yes [provider]     Vital Signs: BP 118/71   Pulse (!) 59   Temp 98.3 F (36.8 C) (Oral)   Resp 15   Ht 5\' 8"  (1.727 m)   Wt 180 lb 5.4 oz (81.8 kg)   SpO2 99%   BMI 27.42 kg/m   Physical Exam Vitals and nursing note reviewed.  Constitutional:      Appearance: She is well-developed.  HENT:     Head: Normocephalic and atraumatic.  Eyes:     Conjunctiva/sclera: Conjunctivae normal.  Pulmonary:     Effort: Pulmonary effort is normal.  Abdominal:     Comments:  Drain Location: Pelvic abscess drain via right transgluteal approach Size: Fr size: 10 Fr Date of placement:  11.18.22  Currently to: Drain collection device: suction bulb 20 ml of serosanguinous fluid noted to be in JP drain. Dressing c/d/i  Musculoskeletal:     Cervical back: Normal range of motion.  Neurological:     Mental Status: She is alert and oriented to person, place, and time.    Imaging: CT ABDOMEN PELVIS W CONTRAST  Result Date: 09/03/2021 CLINICAL DATA:  Follow-up sigmoid  diverticulitis. Patient with increasing pain, fever and rising white blood cell count. EXAM: CT ABDOMEN AND PELVIS WITH CONTRAST TECHNIQUE: Multidetector CT imaging of the abdomen and pelvis was performed using the standard protocol following bolus administration of intravenous contrast. CONTRAST:  35mL OMNIPAQUE IOHEXOL 350 MG/ML SOLN COMPARISON:  CT scan 08/29/2021 FINDINGS: Lower chest: New small bilateral pleural effusions and overlying bibasilar atelectasis. Hepatobiliary: No hepatic lesions or intrahepatic biliary dilatation. The gallbladder is unremarkable. No common bile duct dilatation. Pancreas: No mass, inflammation or ductal dilatation. Spleen: Normal size.  No focal lesions. Adrenals/Urinary Tract: Adrenal glands and kidneys are unremarkable and stable. Stable left-sided parapelvic renal cyst. The bladder is grossly. Stomach/Bowel: The stomach, duodenum and small bowel are unremarkable. No inflammatory changes or obstructive findings. The terminal ileum is normal. The appendix is. Persistent changes of acute diverticulitis involving the mid to lower sigmoid colon. There is now a more clearly defined intramural abscess measuring 2.8 x 2.3 cm. There are also multiple pelvic abscesses, new since the prior study. The largest abscess is in the cul-de-sac region and measures 7.6 x 5.5 cm. Anterior to the uterus there is a smaller abscess measuring 4.3 cm. This may communicate with the cul-de-sac abscess when seen on the sagittal images (image 65/7). In the left adnexal region there is a small abscess measuring 2.3 cm. Vascular/Lymphatic: Stable aortic and iliac artery calcifications. No aneurysm or dissection. The  branch vessels are patent. The major venous structures are patent. No mesenteric or retroperitoneal mass or adenopathy. There are small likely reactive pelvic sidewall nodes Reproductive: The uterus and ovaries are surrounded by inflammation abscesses. Other: No inguinal mass or hernia.  No  subcutaneous lesions. Musculoskeletal: No significant bony findings. IMPRESSION: 1. Persistent changes of acute diverticulitis involving the mid to lower sigmoid colon. 2. Multiple new pelvic abscesses as detailed above. 3. New small bilateral pleural effusions and overlying bibasilar atelectasis. 4. Aortic atherosclerosis. These results will be called to the ordering clinician or representative by the Radiologist Assistant, and communication documented in the PACS or [employer]. Aortic Atherosclerosis (ICD10-I70.0). Electronically Signed   By: Marijo Sanes M.D.   On: 09/03/2021 14:20   CT IMAGE GUIDED DRAINAGE BY PERCUTANEOUS CATHETER  Result Date: 09/04/2021 CLINICAL DATA:  Sigmoid diverticulitis and development of multiple pelvic abscess fluid collections including a large posterior deep pelvic abscess. EXAM: CT GUIDED CATHETER DRAINAGE OF PELVIC PERITONEAL ABSCESS ANESTHESIA/SEDATION: Moderate (conscious) sedation was employed during this procedure. A total of Versed 2.0 mg and Fentanyl 125 mcg was administered intravenously by radiology nursing under my supervision. Moderate Sedation Time: 22 minutes. The patient's level of consciousness and vital signs were monitored continuously by radiology nursing throughout the procedure under my direct supervision. PROCEDURE: The procedure, risks, benefits, and alternatives were explained to the patient. Questions regarding the procedure were encouraged and answered. The patient understands and consents to the procedure. A time out was performed prior to initiating the procedure. CT was performed in a prone position to localize a posterior pelvic abscess. The right gluteal region was prepped with chlorhexidine in a sterile fashion, and a sterile drape was applied covering the operative field. A sterile gown and sterile gloves were used for the procedure. Local anesthesia was provided with 1% Lidocaine. An 18 gauge trocar needle was advanced under CT guidance  via a right posterior transgluteal approach into a posterior pelvic abscess fluid collection. After confirming needle tip position, the needle was removed over a guidewire. The percutaneous tract was dilated and a 10 French percutaneous drain placed. A fluid sample was withdrawn and sent for culture analysis. CT was performed to confirm catheter position. The drain was attached to suction bulb drainage and secured at the skin with a Prolene retention suture and StatLock device. COMPLICATIONS: None FINDINGS: After placement of a drainage catheter into the dominant posterior pelvic abscess, there was return thin, slightly turbid serosanguineous fluid. A sample was sent for culture analysis. IMPRESSION: CT-guided percutaneous catheter drainage of posterior pelvic abscess via a right transgluteal approach. There was return of thin, slightly turbid serosanguineous fluid. A sample was sent for culture analysis. A 10 French drain was placed and attached to suction bulb drainage. Electronically Signed   By: Aletta Edouard M.D.   On: 09/04/2021 10:34    Labs:  CBC: Recent Labs    09/01/21 0340 09/02/21 0345 09/03/21 0332 09/04/21 0329  WBC 12.2* 9.9 11.2* 10.0  HGB 11.4* 10.9* 10.8* 11.1*  HCT 34.2* 32.5* 32.6* 33.4*  PLT 198 215 245 275    COAGS: Recent Labs    08/30/21 0352  INR 1.2    BMP: Recent Labs    09/01/21 0340 09/02/21 0345 09/03/21 0332 09/04/21 0329  NA 138 139 138 138  K 3.8 3.5 3.2* 3.4*  CL 107 108 104 102  CO2 23 25 26 27   GLUCOSE 81 96 101* 101*  BUN 10 <5* <5* 5*  CALCIUM 7.8* 8.0*  8.0* 8.1*  CREATININE 0.78 0.65 0.77 0.68  GFRNONAA >60 >60 >60 >60    LIVER FUNCTION TESTS: Recent Labs    08/29/21 1124 08/30/21 0352  BILITOT 0.6 0.9  AST 17 13*  ALT 16 14  ALKPHOS 49 39  PROT 7.4 6.3*  ALBUMIN 3.9 3.2*    Assessment and Plan:  65 y.o. female inpatient. History of Sigmoid diverticulosis. [facility]ed [facility] the ED at [facility] with abdominal pain with intermitting  nausea and vomiting Found to have leukocytosis with multiple pelvic abscesses.  IR placed a 10 Fr abscess drain into the posterior pelvic abscess via a right trans gluteal approach. WBC  10.0 Cultures show no growth X 1 day. Afebrile.  Surgery continue to follow   Drain Location: Pelvic abscess drain via right transgluteal approach Size: Fr size: 10 Fr Date of placement:  11.18.22  Currently to: Drain collection device: suction bulb 20 ml of serosanguinous fluid noted to be in JP drain. 24 hour output:  Output by Drain (mL) 09/03/21 0701 - 09/03/21 1900 09/03/21 1901 - 09/04/21 0700 09/04/21 0701 - 09/04/21 1900 09/04/21 1901 - 09/05/21 0700 09/05/21 0701 - 09/05/21 1430  Closed System Drain 1 Right Buttock Bulb (JP) 10 Fr.   115 30     Interval imaging/drain manipulation:  None since drain placement  Current examination: Flushes/aspirates easily.  Insertion site unremarkable. Suture and stat lock in place. Dressed appropriately.   Plan: Continue TID flushes with 5 cc NS. Record output Q shift. Dressing changes QD or PRN if soiled.  Call IR APP or on call IR MD if difficulty flushing or sudden change in drain output.  Repeat imaging/possible drain injection once output < 10 mL/QD (excluding flush material.)  Discharge planning: Please contact IR APP or on call IR MD prior to patient d/c to ensure appropriate follow up plans are in place. Typically patient will follow up with IR clinic 10-14 days post d/c for repeat imaging/possible drain injection. IR scheduler will contact patient with date/time of appointment. Patient will need to flush drain QD with 5 cc NS, record output QD, dressing changes every 2-3 days or earlier if soiled.   IR will continue to follow - please call with questions or concerns.   Electronically Signed: Jacqualine Mau, NP 09/05/2021, 2:29 PM   I spent a total of 15 Minutes at the patient's bedside AND on the patient's hospital floor or unit,  greater than 50% of which was counseling/coordinating care for posterior pelvic abscess drain placement

## 2021-09-06 LAB — BASIC METABOLIC PANEL
Anion gap: 7 (ref 5–15)
BUN: 5 mg/dL — ABNORMAL LOW (ref 8–23)
CO2: 30 mmol/L (ref 22–32)
Calcium: 8.4 mg/dL — ABNORMAL LOW (ref 8.9–10.3)
Chloride: 104 mmol/L (ref 98–111)
Creatinine, Ser: 0.76 mg/dL (ref 0.44–1.00)
GFR, Estimated: >60 mL/min (ref >60)
Glucose, Bld: 88 mg/dL (ref 70–99)
Potassium: 3.9 mmol/L (ref 3.5–5.1)
Sodium: 141 mmol/L (ref 135–145)

## 2021-09-06 LAB — CBC
HCT: 32.4 % — ABNORMAL LOW (ref 36.0–46.0)
Hemoglobin: 10.8 g/dL — ABNORMAL LOW (ref 12.0–15.0)
MCH: 31.7 pg (ref 26.0–34.0)
MCHC: 33.3 g/dL (ref 30.0–36.0)
MCV: 95 fL (ref 80.0–100.0)
Platelets: 290 10*3/uL (ref 150–400)
RBC: 3.41 MIL/uL — ABNORMAL LOW (ref 3.87–5.11)
RDW: 12.2 % (ref 11.5–15.5)
WBC: 6.1 10*3/uL (ref 4.0–10.5)
nRBC: 0 % (ref 0.0–0.2)

## 2021-09-06 MED ORDER — OXYCODONE HCL 5 MG PO TABS
5.0000 mg | ORAL_TABLET | ORAL | Status: DC | PRN
Start: 2021-09-06 — End: 2021-09-07
  Administered 2021-09-06: 10 mg via ORAL
  Filled 2021-09-06: qty 2

## 2021-09-06 NOTE — Progress Notes (Signed)
Subjective: Had some pain at drain site and thus some difficulty sleeping. Abdominal pain remains stable to improved. No nausea or emesis. Ambulating. Having bowel movements  Objective: Vital signs in last 24 hours: Temp:  [98.2 F (36.8 C)-98.5 F (36.9 C)] 98.4 F (36.9 C) (11/20 0441) Pulse Rate:  [58-68] 58 (11/20 0441) Resp:  [16-18] 18 (11/20 0441) BP: (115-140)/(69-71) 115/70 (11/20 0441) SpO2:  [98 %-100 %] 99 % (11/20 0441) Weight:  [80 kg] 80 kg (11/20 0441) Last BM Date: 09/05/21  Intake/Output from previous day: 11/19 0701 - 11/20 0700 In: 280 [I.V.:150; IV Piggyback:100] Out: 45 [Drains:45] Intake/Output this shift: No intake/output data recorded.  PE: Gen:  Alert, NAD, pleasant. Sitting up in chair Pulm:  Normal rate and effort on room air Abd: Soft, mild distension, tender in the LLQ>suprapubic tenderness without rigidity or guarding. + bowel sounds. Drain with SS output MSK: bilateral lower extremities without edema or calf TTP Psych: A&Ox3    Lab Results:  Recent Labs    09/04/21 0329 09/06/21 0350  WBC 10.0 6.1  HGB 11.1* 10.8*  HCT 33.4* 32.4*  PLT 275 290    BMET Recent Labs    09/05/21 1754 09/06/21 0350  NA 139 141  K 3.4* 3.9  CL 102 104  CO2 29 30  GLUCOSE 117* 88  BUN 6* 5*  CREATININE 0.83 0.76  CALCIUM 8.6* 8.4*    PT/INR No results for input(s): LABPROT, INR in the last 72 hours. CMP     Component Value Date/Time   NA 141 09/06/2021 0350   K 3.9 09/06/2021 0350   CL 104 09/06/2021 0350   CO2 30 09/06/2021 0350   GLUCOSE 88 09/06/2021 0350   BUN 5 (L) 09/06/2021 0350   CREATININE 0.76 09/06/2021 0350   CALCIUM 8.4 (L) 09/06/2021 0350   PROT 6.3 (L) 08/30/2021 0352   ALBUMIN 3.2 (L) 08/30/2021 0352   AST 13 (L) 08/30/2021 0352   ALT 14 08/30/2021 0352   ALKPHOS 39 08/30/2021 0352   BILITOT 0.9 08/30/2021 0352   GFRNONAA >60 09/06/2021 0350   Lipase     Component Value Date/Time   LIPASE 25 08/29/2021  1124    Studies/Results: CT IMAGE GUIDED DRAINAGE BY PERCUTANEOUS CATHETER  Result Date: 09/04/2021 CLINICAL DATA:  Sigmoid diverticulitis and development of multiple pelvic abscess fluid collections including a large posterior deep pelvic abscess. EXAM: CT GUIDED CATHETER DRAINAGE OF PELVIC PERITONEAL ABSCESS ANESTHESIA/SEDATION: Moderate (conscious) sedation was employed during this procedure. A total of Versed 2.0 mg and Fentanyl 125 mcg was administered intravenously by radiology nursing under my supervision. Moderate Sedation Time: 22 minutes. The patient's level of consciousness and vital signs were monitored continuously by radiology nursing throughout the procedure under my direct supervision. PROCEDURE: The procedure, risks, benefits, and alternatives were explained to the patient. Questions regarding the procedure were encouraged and answered. The patient understands and consents to the procedure. A time out was performed prior to initiating the procedure. CT was performed in a prone position to localize a posterior pelvic abscess. The right gluteal region was prepped with chlorhexidine in a sterile fashion, and a sterile drape was applied covering the operative field. A sterile gown and sterile gloves were used for the procedure. Local anesthesia was provided with 1% Lidocaine. An 18 gauge trocar needle was advanced under CT guidance via a right posterior transgluteal approach into a posterior pelvic abscess fluid collection. After confirming needle tip position, the needle was  removed over a guidewire. The percutaneous tract was dilated and a 10 French percutaneous drain placed. A fluid sample was withdrawn and sent for culture analysis. CT was performed to confirm catheter position. The drain was attached to suction bulb drainage and secured at the skin with a Prolene retention suture and StatLock device. COMPLICATIONS: None FINDINGS: After placement of a drainage catheter into the dominant  posterior pelvic abscess, there was return thin, slightly turbid serosanguineous fluid. A sample was sent for culture analysis. IMPRESSION: CT-guided percutaneous catheter drainage of posterior pelvic abscess via a right transgluteal approach. There was return of thin, slightly turbid serosanguineous fluid. A sample was sent for culture analysis. A 10 French drain was placed and attached to suction bulb drainage. Electronically Signed   By: Aletta Edouard M.D.   On: 09/04/2021 10:34    Anti-infectives: Anti-infectives (From admission, onward)    Start     Dose/Rate Route Frequency Ordered Stop   08/29/21 2200  piperacillin-tazobactam (ZOSYN) IVPB 3.375 g  Status:  Discontinued        3.375 g 100 mL/hr over 30 Minutes Intravenous Every 8 hours 08/29/21 1831 08/29/21 1833   08/29/21 2200  piperacillin-tazobactam (ZOSYN) IVPB 3.375 g        3.375 g 12.5 mL/hr over 240 Minutes Intravenous Every 8 hours 08/29/21 1833     08/29/21 1315  piperacillin-tazobactam (ZOSYN) IVPB 3.375 g        3.375 g 100 mL/hr over 30 Minutes Intravenous  Once 08/29/21 1307 08/29/21 1359        Assessment/Plan Sigmoid diverticulitis with microperforation and abscess - Repeat CT 11/17 w/ multiple new pelvic abscesses. - s/p IR drain 11/18. Monitor output 45 SS/24hours. Culture pending - Cont IV abx  - +BM, tolerating FLD. advance to soft - Hopefully will improve with conservative therapies. If she was to fail to improve with conservative therapy she may require surgery that would likely result in a colectomy and colostomy.   Pending diet tolerance hopefully ready for discharge as early as tomorrow with drain and continued PO abx   FEN - soft, ensure, IVF per [facility] VTE - SCDs, Lovenox ID - [email or web address]   LOS: 8 days    Winferd Humphrey , [facility] 09/06/2021, 8:01 AM Please see Amion for pager number during day hours 7:00am-4:30pm

## 2021-09-06 NOTE — Progress Notes (Signed)
PROGRESS NOTE    Jaclyn York  NAT:[id number] DOB: 01-22-1956 DOA: 08/29/2021 PCP: Wayland Salinas, MD   Chief Complaint  Patient presents with   Abdominal Pain    Nausea,     Brief Narrative:  Patient is a pleasant 65 year old female history of diverticulosis presenting to the ED with a 1 to 2-week history of worsening cramping lower abdominal pain.  Patient noted to have episode of nausea and 2 episodes of bilious emesis on the morning of admission.  CT abdomen and pelvis done concerning for sigmoid diverticulitis with microperforation, ileus.  Patient admitted, placed on bowel rest, empiric IV antibiotics, IV pain medication, IV antiemetics.  Supportive care.  General surgery consulted and following. Persistent abdominal pain, and repeat CT shows multiple abscesses. IR consulted and she underwent 10 Fr drain placed in pelvic abscess via right transgluteal approach with return of serosanguinous fluid. Fluid sample sent for culture analysis. Drain attached to suction bulb drainage.     Assessment & Plan:   Principal Problem:   Sigmoid diverticulitis Active Problems:   Ileus ([place])   Dehydration   Acute sigmoid diverticulitis with microperforation, with multiple  pelvic abscesses on repeat CT abdomen and pelvis.  Gen surgery on board and following the patient.  Continue with IV fluids and IV zosyn.  Pain control with IV morphine and Oral tramadol IR consulted and she underwent 10 Fr drain placed in pelvic abscess via right transgluteal approach with return of  serosanguinous fluid. Fluid sample sent for culture analysis. Drain attached to suction bulb drainage. Started her on clears, abel to tolerate with simethicone. Wbc count wnl .  Continue to monitor.  Some dizziness earlier this am. Able to tolerate diet and having bowel mov    Erythematous Papular rash on the back probably contact dermatitis.  Hydrocortisone cream prn. No itching, improving.   Dehydration  improved.   Hypokalemia:  Replaced.  Repeat BMP pending today.    Mild anemia of acute illness  Hemoglobin around 11.    DVT prophylaxis: (Lovenox) Code Status: (Full code. ) Family Communication: none at bedside.  Disposition:   Status is: Inpatient  Remains inpatient appropriate because: IV antibiotics.        Consultants:  Surgery.  IR Procedures: 10 Fr drain placed in pelvic abscess via right transgluteal approach by IR   Antimicrobials:  Antibiotics Given (last 72 hours)     Date/Time Action Medication Dose Rate   09/03/21 2230 New Bag/Given   piperacillin-tazobactam (ZOSYN) IVPB 3.375 g 3.375 g 12.5 mL/hr   09/04/21 0523 New Bag/Given   piperacillin-tazobactam (ZOSYN) IVPB 3.375 g 3.375 g 12.5 mL/hr   09/04/21 1336 New Bag/Given   piperacillin-tazobactam (ZOSYN) IVPB 3.375 g 3.375 g 12.5 mL/hr   09/04/21 2114 New Bag/Given   piperacillin-tazobactam (ZOSYN) IVPB 3.375 g 3.375 g 12.5 mL/hr   09/05/21 4270 New Bag/Given   piperacillin-tazobactam (ZOSYN) IVPB 3.375 g 3.375 g 12.5 mL/hr   09/05/21 1234 New Bag/Given   piperacillin-tazobactam (ZOSYN) IVPB 3.375 g 3.375 g 12.5 mL/hr   09/05/21 2100 New Bag/Given   piperacillin-tazobactam (ZOSYN) IVPB 3.375 g 3.375 g 12.5 mL/hr   09/06/21 0513 New Bag/Given   piperacillin-tazobactam (ZOSYN) IVPB 3.375 g 3.375 g 12.5 mL/hr   09/06/21 1312 New Bag/Given   piperacillin-tazobactam (ZOSYN) IVPB 3.375 g 3.375 g 12.5 mL/hr         Subjective: No nausea, vomiting or abdominal pain.    Objective: Vitals:   09/06/21 1329 09/06/21 1448 09/06/21 1450 09/06/21 1452  BP: 109/60 (!) 119/57 114/73 128/69  Pulse: (!) 58 (!) 55 64 (!) 56  Resp:      Temp: 98.4 F (36.9 C)     TempSrc: Oral     SpO2: 97%     Weight:      Height:        Intake/Output Summary (Last 24 hours) at 09/06/2021 1738 Last data filed at 09/06/2021 1400 Gross per 24 hour  Intake 280 ml  Output 25 ml  Net 255 ml    Filed Weights    09/03/21 0545 09/05/21 0500 09/06/21 0441  Weight: 79.8 kg 81.8 kg 80 kg    Examination:  General exam: Appears calm and comfortable  Respiratory system: Clear to auscultation. Respiratory effort normal. Cardiovascular system: S1 & S2 heard, RRR. No JVD,  No pedal edema. Gastrointestinal system: Abdomen is soft, mildly distended, bowel sounds wnl.  Central nervous system: Alert and oriented. No focal neurological deficits. Extremities: Symmetric 5 x 5 power. Skin: No rashes, lesions or ulcers Psychiatry: Mood & affect appropriate.         Data Reviewed: I have personally reviewed following labs and imaging studies  CBC: Recent Labs  Lab 08/31/21 0341 09/01/21 0340 09/02/21 0345 09/03/21 0332 09/04/21 0329 09/06/21 0350  WBC 13.1* 12.2* 9.9 11.2* 10.0 6.1  NEUTROABS 10.9*  --   --   --   --   --   HGB 11.3* 11.4* 10.9* 10.8* 11.1* 10.8*  HCT 34.3* 34.2* 32.5* 32.6* 33.4* 32.4*  MCV 96.9 95.3 93.7 93.9 93.6 95.0  PLT 195 198 215 245 275 290     Basic Metabolic Panel: Recent Labs  Lab 09/01/21 0340 09/02/21 0345 09/03/21 0332 09/03/21 0630 09/04/21 0329 09/05/21 1754 09/06/21 0350  NA 138 139 138  --  138 139 141  K 3.8 3.5 3.2*  --  3.4* 3.4* 3.9  CL 107 108 104  --  102 102 104  CO2 23 25 26   --  27 29 30   GLUCOSE 81 96 101*  --  101* 117* 88  BUN 10 <5* <5*  --  5* 6* 5*  CREATININE 0.78 0.65 0.77  --  0.68 0.83 0.76  CALCIUM 7.8* 8.0* 8.0*  --  8.1* 8.6* 8.4*  MG 2.0  --   --  2.0  --   --   --   PHOS  --   --  2.7  --   --   --   --      GFR: Estimated Creatinine Clearance: 79.9 mL/min (by C-G formula based on SCr of 0.76 mg/dL).  Liver Function Tests: No results for input(s): AST, ALT, ALKPHOS, BILITOT, PROT, ALBUMIN in the last 168 hours.   CBG: No results for input(s): GLUCAP in the last 168 hours.   Recent Results (from the past 240 hour(s))  Resp Panel by RT-PCR (Flu A&B, Covid) Nasopharyngeal Swab     Status: None   Collection Time:  08/29/21 11:24 AM   Specimen: Nasopharyngeal Swab; Nasopharyngeal(NP) swabs in vial transport medium  Result Value Ref Range Status   SARS Coronavirus 2 by RT PCR NEGATIVE NEGATIVE Final    Comment: (NOTE) SARS-CoV-2 target nucleic acids are NOT DETECTED.  The SARS-CoV-2 RNA is generally detectable in upper respiratory specimens during the acute phase of infection. The lowest concentration of SARS-CoV-2 viral copies this assay can detect is 138 copies/mL. A negative result does not preclude SARS-Cov-2 infection and should not be used as the sole basis  for treatment or other patient management decisions. A negative result may occur with  improper specimen collection/handling, submission of specimen other than nasopharyngeal swab, presence of viral mutation(s) within the areas targeted by this assay, and inadequate number of viral copies(<138 copies/mL). A negative result must be combined with clinical observations, patient history, and epidemiological information. The expected result is Negative.  Fact Sheet for Patients:  EntrepreneurPulse.com.au  Fact Sheet for Healthcare Providers:  IncredibleEmployment.be  This test is no t yet approved or cleared by the [place] FDA and  has been authorized for detection and/or diagnosis of SARS-CoV-2 by FDA under an Emergency Use Authorization (EUA). This EUA will remain  in effect (meaning this test can be used) for the duration of the COVID-19 declaration under Section 564(b)(1) of the Act, 21 U.S.C.section 360bbb-3(b)(1), unless the authorization is terminated  or revoked sooner.       Influenza A by PCR NEGATIVE NEGATIVE Final   Influenza B by PCR NEGATIVE NEGATIVE Final    Comment: (NOTE) The Xpert Xpress SARS-CoV-2/FLU/RSV plus assay is intended as an aid in the diagnosis of influenza from Nasopharyngeal swab specimens and should not be used as a sole basis for treatment. Nasal washings  and aspirates are unacceptable for Xpert Xpress SARS-CoV-2/FLU/RSV testing.  Fact Sheet for Patients: EntrepreneurPulse.com.au  Fact Sheet for Healthcare Providers: IncredibleEmployment.be  This test is not yet approved or cleared by the [place] FDA and has been authorized for detection and/or diagnosis of SARS-CoV-2 by FDA under an Emergency Use Authorization (EUA). This EUA will remain in effect (meaning this test can be used) for the duration of the COVID-19 declaration under Section 564(b)(1) of the Act, 21 U.S.C. section 360bbb-3(b)(1), unless the authorization is terminated or revoked.  Performed at [facility], [address] [place], [place] [address]   Aerobic/Anaerobic Culture w Gram Stain (surgical/deep wound)     Status: None (Preliminary result)   Collection Time: 09/04/21  9:59 AM   Specimen: Abscess  Result Value Ref Range Status   Specimen Description   Final    ABSCESS Performed at [facility] [address], [place], [place] [address]    Special Requests   Final    Normal Performed at [facility], [place] [address] [place], [place] [address]    Gram Stain   Final    RARE WBC PRESENT,BOTH PMN AND MONONUCLEAR NO ORGANISMS SEEN    Culture   Final    NO GROWTH 2 DAYS NO ANAEROBES ISOLATED; CULTURE IN PROGRESS FOR 5 DAYS Performed at [facility], [place] [address], [place],  [address]    Report Status PENDING  Incomplete          Radiology Studies: No results found.      Scheduled Meds:  enoxaparin (LOVENOX) injection  40 mg Subcutaneous Q24H   feeding supplement  237 mL Oral BID BM   hydrocortisone cream   Topical QID   senna-docusate  1 tablet Oral BID   sodium chloride flush  10 mL Intracatheter Q8H   sodium chloride flush  3 mL Intravenous Q12H   Continuous Infusions:  sodium chloride 75 mL/hr at 09/05/21 1848    piperacillin-tazobactam (ZOSYN)  IV 3.375 g (09/06/21 1312)     LOS: 8 days        Hosie Poisson, MD [facility]   To contact the attending provider between 7A-7P or the covering provider during after hours 7P-7A, please log into  the web site [email or web address] and access using universal  password for that web site. If you do not have the password, please call the hospital operator.  09/06/2021, 5:38 PM

## 2021-09-07 MED ORDER — ONDANSETRON HCL 4 MG PO TABS: 4.0000 mg | ORAL_TABLET | Freq: Four times a day (QID) | ORAL | 0 refills | Status: AC | PRN

## 2021-09-07 MED ORDER — ENSURE ENLIVE PO LIQD
237.0000 mL | Freq: Two times a day (BID) | ORAL | 1 refills | Status: AC
Start: 2021-09-07 — End: 2021-11-06

## 2021-09-07 MED ORDER — HYDROCORTISONE 1 % EX CREA: TOPICAL_CREAM | Freq: Four times a day (QID) | CUTANEOUS | 0 refills | Status: DC

## 2021-09-07 MED ORDER — SIMETHICONE 80 MG PO CHEW: 80.0000 mg | CHEWABLE_TABLET | Freq: Four times a day (QID) | ORAL | 0 refills | Status: AC | PRN

## 2021-09-07 MED ORDER — SENNOSIDES-DOCUSATE SODIUM 8.6-50 MG PO TABS: 1.0000 | ORAL_TABLET | Freq: Two times a day (BID) | ORAL | 0 refills | Status: AC | PRN

## 2021-09-07 MED ORDER — BISACODYL 10 MG RE SUPP: 10.0000 mg | Freq: Every day | RECTAL | 0 refills | Status: DC | PRN

## 2021-09-07 MED ORDER — AMOXICILLIN-POT CLAVULANATE 875-125 MG PO TABS: 1.0000 | ORAL_TABLET | Freq: Two times a day (BID) | ORAL | 0 refills | Status: AC

## 2021-09-07 MED ORDER — OXYCODONE HCL 5 MG PO TABS: 5.0000 mg | ORAL_TABLET | Freq: Four times a day (QID) | ORAL | 0 refills | Status: AC | PRN

## 2021-09-07 NOTE — Discharge Instructions (Signed)
Flush catheter with 69ml every day.

## 2021-09-07 NOTE — Plan of Care (Signed)
  Problem: Health Behavior/Discharge Planning: Goal: Ability to manage health-related needs will improve 09/07/2021 1255 by Benay Pillow, RN Outcome: Adequate for Discharge 09/07/2021 1255 by Benay Pillow, RN Outcome: Adequate for Discharge   Problem: Clinical Measurements: Goal: Ability to maintain clinical measurements within normal limits will improve 09/07/2021 1255 by Benay Pillow, RN Outcome: Adequate for Discharge 09/07/2021 1255 by Benay Pillow, RN Outcome: Adequate for Discharge Goal: Will remain free from infection 09/07/2021 1255 by Benay Pillow, RN Outcome: Adequate for Discharge 09/07/2021 1255 by Benay Pillow, RN Outcome: Adequate for Discharge Goal: Diagnostic test results will improve 09/07/2021 1255 by Benay Pillow, RN Outcome: Adequate for Discharge 09/07/2021 1255 by Benay Pillow, RN Outcome: Adequate for Discharge Goal: Respiratory complications will improve 09/07/2021 1255 by Benay Pillow, RN Outcome: Adequate for Discharge 09/07/2021 1255 by Benay Pillow, RN Outcome: Adequate for Discharge Goal: Cardiovascular complication will be avoided 09/07/2021 1255 by Benay Pillow, RN Outcome: Adequate for Discharge 09/07/2021 1255 by Benay Pillow, RN Outcome: Adequate for Discharge   Problem: Activity: Goal: Risk for activity intolerance will decrease 09/07/2021 1255 by Benay Pillow, RN Outcome: Adequate for Discharge 09/07/2021 1255 by Benay Pillow, RN Outcome: Adequate for Discharge   Problem: Coping: Goal: Level of anxiety will decrease 09/07/2021 1255 by Benay Pillow, RN Outcome: Adequate for Discharge 09/07/2021 1255 by Benay Pillow, RN Outcome: Adequate for Discharge   Problem: Pain Managment: Goal: General experience of comfort will improve 09/07/2021 1255 by Benay Pillow, RN Outcome: Adequate for Discharge 09/07/2021 1255 by Benay Pillow, RN Outcome:  Adequate for Discharge   Problem: Safety: Goal: Ability to remain free from injury will improve 09/07/2021 1255 by Benay Pillow, RN Outcome: Adequate for Discharge 09/07/2021 1255 by Benay Pillow, RN Outcome: Adequate for Discharge

## 2021-09-07 NOTE — Progress Notes (Signed)
Pt ready for DC. Went through all drain teaching aspects including emptying, maintaining suction, preventing and s/s of infection and dressing care. All teaching demonstrated with family at bedside. All questions answered. Supplies provided for care. DC instructions reviewed, reviewed follow up and medications, all questions clarified. Pt collected all belongings. Pt dc' d via wheelchair accompanied by staff

## 2021-09-07 NOTE — Progress Notes (Signed)
   Subjective/Chief Complaint: PT doing well  Tol soft diet w/o pain    Objective: Vital signs in last 24 hours: Temp:  [97.7 F (36.5 C)-98.4 F (36.9 C)] 98.1 F (36.7 C) (11/21 0338) Pulse Rate:  [55-75] 60 (11/21 0338) Resp:  [20] 20 (11/21 0338) BP: (108-132)/(49-73) 108/49 (11/21 0338) SpO2:  [97 %-99 %] 99 % (11/21 0338) Weight:  [82.2 kg] 82.2 kg (11/21 0142) Last BM Date: 09/05/21  Intake/Output from previous day: 11/20 0701 - 11/21 0700 In: 1256.1 [P.O.:240; I.V.:836.1; IV Piggyback:150] Out: 50 [Drains:50] Intake/Output this shift: No intake/output data recorded.  PE:  Constitutional: No acute distress, conversant, appears states age. Eyes: Anicteric sclerae, moist conjunctiva, no lid lag Lungs: Clear to auscultation bilaterally, normal respiratory effort CV: regular rate and rhythm, no murmurs, no peripheral edema, pedal pulses 2+ GI: Soft, no masses or hepatosplenomegaly, non-tender to palpation Skin: No rashes, palpation reveals normal turgor Psychiatric: appropriate judgment and insight, oriented to person, place, and time   Lab Results:  Recent Labs    09/06/21 0350  WBC 6.1  HGB 10.8*  HCT 32.4*  PLT 290   BMET Recent Labs    09/05/21 1754 09/06/21 0350  NA 139 141  K 3.4* 3.9  CL 102 104  CO2 29 30  GLUCOSE 117* 88  BUN 6* 5*  CREATININE 0.83 0.76  CALCIUM 8.6* 8.4*   PT/INR No results for input(s): LABPROT, INR in the last 72 hours. ABG No results for input(s): PHART, HCO3 in the last 72 hours.  Invalid input(s): PCO2, PO2  Studies/Results: No results found.  Anti-infectives: Anti-infectives (From admission, onward)    Start     Dose/Rate Route Frequency Ordered Stop   08/29/21 2200  piperacillin-tazobactam (ZOSYN) IVPB 3.375 g  Status:  Discontinued        3.375 g 100 mL/hr over 30 Minutes Intravenous Every 8 hours 08/29/21 1831 08/29/21 1833   08/29/21 2200  piperacillin-tazobactam (ZOSYN) IVPB 3.375 g        3.375  g 12.5 mL/hr over 240 Minutes Intravenous Every 8 hours 08/29/21 1833     08/29/21 1315  piperacillin-tazobactam (ZOSYN) IVPB 3.375 g        3.375 g 100 mL/hr over 30 Minutes Intravenous  Once 08/29/21 1307 08/29/21 1359       Assessment/Plan: Sigmoid diverticulitis with microperforation and abscess - Repeat CT 11/17 w/ multiple new pelvic abscesses. - s/p IR drain 11/18. Monitor output 45 SS/24hours. Culture pending  Ok for home today with drain IR to set up drain f/u   Will need c-scope in 6-8weeks with GI and f/u with [place] thereafter   LOS: 9 days    Ralene Ok 09/07/2021

## 2021-09-07 NOTE — Plan of Care (Signed)
  Problem: Activity: Goal: Risk for activity intolerance will decrease Outcome: Progressing   Problem: Pain Managment: Goal: General experience of comfort will improve Outcome: Progressing   Problem: Safety: Goal: Ability to remain free from injury will improve Outcome: Progressing   

## 2021-09-08 ENCOUNTER — Other Ambulatory Visit: Payer: Self-pay | Admitting: General Surgery

## 2021-09-08 DIAGNOSIS — N739 Female pelvic inflammatory disease, unspecified: Secondary | ICD-10-CM

## 2021-09-08 DIAGNOSIS — K5792 Diverticulitis of intestine, part unspecified, without perforation or abscess without bleeding: Secondary | ICD-10-CM

## 2021-09-08 NOTE — Discharge Summary (Signed)
Physician Discharge Summary  Jaclyn York QGB:[id number] DOB: [birth year] DOA: 08/29/2021  PCP: Wayland Salinas, MD  Admit date: 08/29/2021 Discharge date: 09/07/2021  Admitted From: Home Disposition: Home  Recommendations for Outpatient Follow-up:  Follow up with PCP in 1-2 weeks Please obtain BMP/CBC in one week Please follow up with general surgery as recommended.  Please follow up with IR as recommended.  Please follow up with gastroenterology in 6 to 8 weeks for colonoscopy.   Discharge Condition: stable CODE STATUS:Full code.  Diet recommendation: Heart Healthy   Brief/Interim Summary:  Patient is a pleasant 65 year old female history of diverticulosis presenting to the ED with a 1 to 2-week history of worsening cramping lower abdominal pain.  Patient noted to have episode of nausea and 2 episodes of bilious emesis on the morning of admission.  CT abdomen and pelvis done concerning for sigmoid diverticulitis with microperforation, ileus.  Patient admitted, placed on bowel rest, empiric IV antibiotics, IV pain medication, IV antiemetics.  Supportive care.  General surgery consulted and following. Persistent abdominal pain, and repeat CT shows multiple abscesses. IR consulted and she underwent 10 Fr drain placed in pelvic abscess via right transgluteal approach with return of serosanguinous fluid. Fluid sample sent for culture analysis. Drain attached to suction bulb drainage. Discharge Diagnoses:  Principal Problem:   Sigmoid diverticulitis Active Problems:   Ileus ([place])   Dehydration  Acute sigmoid diverticulitis with microperforation, with multiple  pelvic abscesses on repeat CT abdomen and pelvis.  Gen surgery on board and following the patient, cleared her for discharge.  IR consulted and she underwent 10 Fr drain placed in pelvic abscess via right transgluteal approach with return of  serosanguinous fluid. Fluid sample sent for culture analysis. Drain attached to  suction bulb drainage. So far cultures have been negative.  Started her on clears,  advanced diet as tolerated.  Wbc count wnl .  Recommend outpatient follow up with with IR in 1 to 2 weeks.       Erythematous Papular rash on the back probably contact dermatitis.  Hydrocortisone cream prn. No itching, improving.    Dehydration improved.    Hypokalemia:  Replaced.       Mild anemia of acute illness  Hemoglobin around 11.        Discharge Instructions  Discharge Instructions     Diet - low sodium heart healthy   Complete by: As directed    Discharge instructions   Complete by: As directed    Please follow up with PCP in one week.  Please follow up with gastroenterology in 6 to 8 weeks for a colonoscopy.  Please follow up with IR in 1 to 2 weeks, they will make appointment.  Please follow up with General Surgery as scheduled.   Discharge wound care:   Complete by: As directed    Will need follow up with IR clinic 10-14 days post d/c for repeat imaging/possible drain injection. IR scheduler will contact patient with date/time of appointment. Patient will need to flush drain QD with 5 cc NS, record output QD, dressing changes every 2-3 days or earlier if soiled.      Allergies as of 09/07/2021   No Known Allergies      Medication List     TAKE these medications    amoxicillin-clavulanate 875-125 MG tablet Commonly known as: Augmentin Take 1 tablet by mouth every 12 (twelve) hours for 10 days.   bisacodyl 10 MG suppository Commonly known as: DULCOLAX Place 1 suppository (10  mg total) rectally daily as needed for moderate constipation.   estradiol 0.025 MG/24HR Commonly known as: VIVELLE-DOT Place 1 patch onto the skin 2 (two) times a week. Monday and Thursday   feeding supplement Liqd Take 237 mLs by mouth 2 (two) times daily between meals.   hydrocortisone cream 1 % Apply topically 4 (four) times daily.   levocetirizine 5 MG tablet Commonly known as:  XYZAL Take 5 mg by mouth every evening.   ondansetron 4 MG tablet Commonly known as: ZOFRAN Take 1 tablet (4 mg total) by mouth every 6 (six) hours as needed for nausea.   oxyCODONE 5 MG immediate release tablet Commonly known as: Oxy IR/ROXICODONE Take 1 tablet (5 mg total) by mouth every 6 (six) hours as needed for up to 3 days for severe pain (5 mg for 4-6/10 pain. 10 mg for >6/10 pain).   progesterone 100 MG capsule Commonly known as: PROMETRIUM Take 100 mg by mouth at bedtime.   senna-docusate 8.6-50 MG tablet Commonly known as: Senokot-S Take 1 tablet by mouth 2 (two) times daily as needed for mild constipation.   simethicone 80 MG chewable tablet Commonly known as: MYLICON Chew 1 tablet (80 mg total) by mouth every 6 (six) hours as needed for flatulence.               Discharge Care Instructions  (From admission, onward)           Start     Ordered   09/07/21 0000  Discharge wound care:       Comments: Will need follow up with IR clinic 10-14 days post d/c for repeat imaging/possible drain injection. IR scheduler will contact patient with date/time of appointment. Patient will need to flush drain QD with 5 cc NS, record output QD, dressing changes every 2-3 days or earlier if soiled.   09/07/21 1223            Follow-up Information     [facility], [place]. Call.   Specialty: General Surgery Why: call after your colonscopy to discuss surgery Contact information: [address] [place] [address] [phone]         [facility] Follow up in 2 week(s).   Why: Pt needs follow appt w/ CT and injection to see if abscess has resolved Contact information: [place] [address] [phone]         Wayland Salinas, MD. Schedule an appointment as soon as possible for a visit in 1 week(s).   Specialty: Family Medicine Contact information: [place] [phone]         Juanita Craver, MD. Schedule an appointment as soon as possible for a visit in 1 week(s).   Specialty: Gastroenterology Why: for colonoscopy in 6 to 8 weeks. Contact information: [address] [place] [address] [phone]                No Known Allergies  Consultations: General surgery  Ir.    Procedures/Studies: CT ABDOMEN PELVIS W CONTRAST  Result Date: 09/03/2021 CLINICAL DATA:  Follow-up sigmoid diverticulitis. Patient with increasing pain, fever and rising white blood cell count. EXAM: CT ABDOMEN AND PELVIS WITH CONTRAST TECHNIQUE: Multidetector CT imaging of the abdomen and pelvis was performed using the standard protocol following bolus administration of intravenous contrast. CONTRAST:  68mL OMNIPAQUE IOHEXOL 350 MG/ML SOLN COMPARISON:  CT scan 08/29/2021 FINDINGS: Lower chest:  New small bilateral pleural effusions and overlying bibasilar atelectasis. Hepatobiliary: No hepatic lesions or intrahepatic biliary dilatation. The gallbladder is unremarkable. No common bile duct dilatation. Pancreas: No mass, inflammation or ductal dilatation. Spleen: Normal size.  No focal lesions. Adrenals/Urinary Tract: Adrenal glands and kidneys are unremarkable and stable. Stable left-sided parapelvic renal cyst. The bladder is grossly. Stomach/Bowel: The stomach, duodenum and small bowel are unremarkable. No inflammatory changes or obstructive findings. The terminal ileum is normal. The appendix is. Persistent changes of acute diverticulitis involving the mid to lower sigmoid colon. There is now a more clearly defined intramural abscess measuring 2.8 x 2.3 cm. There are also multiple pelvic abscesses, new since the prior study. The largest abscess is in the cul-de-sac region and measures 7.6 x 5.5 cm. Anterior to the uterus there is a smaller abscess measuring 4.3 cm. This may communicate with the cul-de-sac abscess when seen on the  sagittal images (image 65/7). In the left adnexal region there is a small abscess measuring 2.3 cm. Vascular/Lymphatic: Stable aortic and iliac artery calcifications. No aneurysm or dissection. The branch vessels are patent. The major venous structures are patent. No mesenteric or retroperitoneal mass or adenopathy. There are small likely reactive pelvic sidewall nodes Reproductive: The uterus and ovaries are surrounded by inflammation abscesses. Other: No inguinal mass or hernia.  No subcutaneous lesions. Musculoskeletal: No significant bony findings. IMPRESSION: 1. Persistent changes of acute diverticulitis involving the mid to lower sigmoid colon. 2. Multiple new pelvic abscesses as detailed above. 3. New small bilateral pleural effusions and overlying bibasilar atelectasis. 4. Aortic atherosclerosis. These results will be called to the ordering clinician or representative by the Radiologist Assistant, and communication documented in the PACS or [employer]. Aortic Atherosclerosis (ICD10-I70.0). Electronically Signed   By: Marijo Sanes M.D.   On: 09/03/2021 14:20   CT ABDOMEN PELVIS W CONTRAST  Result Date: 08/29/2021 CLINICAL DATA:  Left lower quadrant abdomen pain assess for diverticulitis. EXAM: CT ABDOMEN AND PELVIS WITH CONTRAST TECHNIQUE: Multidetector CT imaging of the abdomen and pelvis was performed using the standard protocol following bolus administration of intravenous contrast. CONTRAST:  148mL OMNIPAQUE IOHEXOL 300 MG/ML  SOLN COMPARISON:  None. FINDINGS: Lower chest: No acute abnormality. Hepatobiliary: No focal liver abnormality is seen. No gallstones, gallbladder wall thickening, or biliary dilatation. Pancreas: Unremarkable. No pancreatic ductal dilatation or surrounding inflammatory changes. Spleen: Normal in size without focal abnormality. Adrenals/Urinary Tract: Adrenal glands are unremarkable. Kidneys are normal, without renal calculi, focal lesion, or hydronephrosis. Bladder is  unremarkable. Left parapelvic renal cysts are identified. Stomach/Bowel: There is stranding inflammation surrounding the sigmoid colon with a few small foci of air adjacent to the area inflammation suggesting small focal micro perforation. Free fluid is identified in the pelvis. There is mild dilatation of the small bowel loops in the pelvis likely secondary to focal ileus. The small bowel loops are otherwise normal. The stomach is normal. The appendix is normal. Vascular/Lymphatic: Aortic atherosclerosis. No enlarged abdominal or pelvic lymph nodes. Reproductive: Uterine fibroids are noted. No definite adnexal masses are noted. Other: Free fluid in the pelvis as previously described. Musculoskeletal: Degenerative joint changes of the lower lumbar spine are identified. IMPRESSION: 1. Findings consistent with sigmoid diverticulitis with a few small foci of air adjacent to the area inflammation suggesting small focal micro perforation. Free fluid is identified in the pelvis. 2. Mild dilatation of the small bowel loops in the pelvis likely secondary to focal ileus. 3. Aortic atherosclerosis. Aortic Atherosclerosis (ICD10-I70.0). These results were  called by telephone at the time of interpretation on 08/29/2021 at 12:57 pm to provider [facility] , who verbally acknowledged these results. Electronically Signed   By: Abelardo Diesel M.D.   On: 08/29/2021 12:57   CT IMAGE GUIDED DRAINAGE BY PERCUTANEOUS CATHETER  Result Date: 09/04/2021 CLINICAL DATA:  Sigmoid diverticulitis and development of multiple pelvic abscess fluid collections including a large posterior deep pelvic abscess. EXAM: CT GUIDED CATHETER DRAINAGE OF PELVIC PERITONEAL ABSCESS ANESTHESIA/SEDATION: Moderate (conscious) sedation was employed during this procedure. A total of Versed 2.0 mg and Fentanyl 125 mcg was administered intravenously by radiology nursing under my supervision. Moderate Sedation Time: 22 minutes. The patient's level of  consciousness and vital signs were monitored continuously by radiology nursing throughout the procedure under my direct supervision. PROCEDURE: The procedure, risks, benefits, and alternatives were explained to the patient. Questions regarding the procedure were encouraged and answered. The patient understands and consents to the procedure. A time out was performed prior to initiating the procedure. CT was performed in a prone position to localize a posterior pelvic abscess. The right gluteal region was prepped with chlorhexidine in a sterile fashion, and a sterile drape was applied covering the operative field. A sterile gown and sterile gloves were used for the procedure. Local anesthesia was provided with 1% Lidocaine. An 18 gauge trocar needle was advanced under CT guidance via a right posterior transgluteal approach into a posterior pelvic abscess fluid collection. After confirming needle tip position, the needle was removed over a guidewire. The percutaneous tract was dilated and a 10 French percutaneous drain placed. A fluid sample was withdrawn and sent for culture analysis. CT was performed to confirm catheter position. The drain was attached to suction bulb drainage and secured at the skin with a Prolene retention suture and StatLock device. COMPLICATIONS: None FINDINGS: After placement of a drainage catheter into the dominant posterior pelvic abscess, there was return thin, slightly turbid serosanguineous fluid. A sample was sent for culture analysis. IMPRESSION: CT-guided percutaneous catheter drainage of posterior pelvic abscess via a right transgluteal approach. There was return of thin, slightly turbid serosanguineous fluid. A sample was sent for culture analysis. A 10 French drain was placed and attached to suction bulb drainage. Electronically Signed   By: Aletta Edouard M.D.   On: 09/04/2021 10:34      Subjective: No nausea, vomiting and abdominal pain.   Discharge Exam: Vitals:    09/06/21 2043 09/07/21 0338  BP: 132/67 (!) 108/49  Pulse: 75 60  Resp: 20 20  Temp: 97.7 F (36.5 C) 98.1 F (36.7 C)  SpO2: 97% 99%   Vitals:   09/06/21 1452 09/06/21 2043 09/07/21 0142 09/07/21 0338  BP: 128/69 132/67  (!) 108/49  Pulse: (!) 56 75  60  Resp:  20  20  Temp:  97.7 F (36.5 C)  98.1 F (36.7 C)  TempSrc:  Oral  Oral  SpO2:  97%  99%  Weight:  82.2 kg 82.2 kg   Height:        General: Pt is alert, awake, not in acute distress Cardiovascular: RRR, S1/S2 +, no rubs, no gallops Respiratory: CTA bilaterally, no wheezing, no rhonchi Abdominal: Soft, NT, ND, bowel sounds + Extremities: no edema, no cyanosis    The results of significant diagnostics from this hospitalization (including imaging, microbiology, ancillary and laboratory) are listed below for reference.     Microbiology: Recent Results (from the past 240 hour(s))  Resp Panel by RT-PCR (Flu A&B, Covid) Nasopharyngeal  Swab     Status: None   Collection Time: 08/29/21 11:24 AM   Specimen: Nasopharyngeal Swab; Nasopharyngeal(NP) swabs in vial transport medium  Result Value Ref Range Status   SARS Coronavirus 2 by RT PCR NEGATIVE NEGATIVE Final    Comment: (NOTE) SARS-CoV-2 target nucleic acids are NOT DETECTED.  The SARS-CoV-2 RNA is generally detectable in upper respiratory specimens during the acute phase of infection. The lowest concentration of SARS-CoV-2 viral copies this assay can detect is 138 copies/mL. A negative result does not preclude SARS-Cov-2 infection and should not be used as the sole basis for treatment or other patient management decisions. A negative result may occur with  improper specimen collection/handling, submission of specimen other than nasopharyngeal swab, presence of viral mutation(s) within the areas targeted by this assay, and inadequate number of viral copies(<138 copies/mL). A negative result must be combined with clinical observations, patient history, and  epidemiological information. The expected result is Negative.  Fact Sheet for Patients:  EntrepreneurPulse.com.au  Fact Sheet for Healthcare Providers:  IncredibleEmployment.be  This test is no t yet approved or cleared by the [place] FDA and  has been authorized for detection and/or diagnosis of SARS-CoV-2 by FDA under an Emergency Use Authorization (EUA). This EUA will remain  in effect (meaning this test can be used) for the duration of the COVID-19 declaration under Section 564(b)(1) of the Act, 21 U.S.C.section 360bbb-3(b)(1), unless the authorization is terminated  or revoked sooner.       Influenza A by PCR NEGATIVE NEGATIVE Final   Influenza B by PCR NEGATIVE NEGATIVE Final    Comment: (NOTE) The Xpert Xpress SARS-CoV-2/FLU/RSV plus assay is intended as an aid in the diagnosis of influenza from Nasopharyngeal swab specimens and should not be used as a sole basis for treatment. Nasal washings and aspirates are unacceptable for Xpert Xpress SARS-CoV-2/FLU/RSV testing.  Fact Sheet for Patients: EntrepreneurPulse.com.au  Fact Sheet for Healthcare Providers: IncredibleEmployment.be  This test is not yet approved or cleared by the [place] FDA and has been authorized for detection and/or diagnosis of SARS-CoV-2 by FDA under an Emergency Use Authorization (EUA). This EUA will remain in effect (meaning this test can be used) for the duration of the COVID-19 declaration under Section 564(b)(1) of the Act, 21 U.S.C. section 360bbb-3(b)(1), unless the authorization is terminated or revoked.  Performed at [facility], [address] [place], [place] [address]   Aerobic/Anaerobic Culture w Gram Stain (surgical/deep wound)     Status: None (Preliminary result)   Collection Time: 09/04/21  9:59 AM   Specimen: Abscess  Result Value Ref Range Status   Specimen Description   Final     ABSCESS Performed at [facility] [address] [place], [place] [address]    Special Requests   Final    Normal Performed at [facility], [place] [address] [place], [place] [address]    Gram Stain   Final    RARE WBC PRESENT,BOTH PMN AND MONONUCLEAR NO ORGANISMS SEEN    Culture   Final    NO GROWTH 3 DAYS NO ANAEROBES ISOLATED; CULTURE IN PROGRESS FOR 5 DAYS Performed at [facility], [place] [address], [place], [place] [address]    Report Status PENDING  Incomplete     Labs: BNP (last 3 results) No results for input(s): BNP in the last 8760 hours. Basic Metabolic Panel: Recent Labs  Lab 09/01/21 0340 09/02/21 0345 09/03/21 0332 09/03/21 0630 09/04/21  8546 09/05/21 1754 09/06/21 0350  NA 138 139 138  --  138 139 141  K 3.8 3.5 3.2*  --  3.4* 3.4* 3.9  CL 107 108 104  --  102 102 104  CO2 23 25 26   --  27 29 30   GLUCOSE 81 96 101*  --  101* 117* 88  BUN 10 <5* <5*  --  5* 6* 5*  CREATININE 0.78 0.65 0.77  --  0.68 0.83 0.76  CALCIUM 7.8* 8.0* 8.0*  --  8.1* 8.6* 8.4*  MG 2.0  --   --  2.0  --   --   --   PHOS  --   --  2.7  --   --   --   --    Liver Function Tests: No results for input(s): AST, ALT, ALKPHOS, BILITOT, PROT, ALBUMIN in the last 168 hours. No results for input(s): LIPASE, AMYLASE in the last 168 hours. No results for input(s): AMMONIA in the last 168 hours. CBC: Recent Labs  Lab 09/01/21 0340 09/02/21 0345 09/03/21 0332 09/04/21 0329 09/06/21 0350  WBC 12.2* 9.9 11.2* 10.0 6.1  HGB 11.4* 10.9* 10.8* 11.1* 10.8*  HCT 34.2* 32.5* 32.6* 33.4* 32.4*  MCV 95.3 93.7 93.9 93.6 95.0  PLT 198 215 245 275 290   Cardiac Enzymes: No results for input(s): CKTOTAL, CKMB, CKMBINDEX, TROPONINI in the last 168 hours. BNP: Invalid input(s): POCBNP CBG: No results for input(s): GLUCAP in the last 168 hours. D-Dimer No results for input(s): DDIMER in the last 72 hours. Hgb A1c No results for input(s): HGBA1C in  the last 72 hours. Lipid Profile No results for input(s): CHOL, HDL, LDLCALC, TRIG, CHOLHDL, LDLDIRECT in the last 72 hours. Thyroid function studies No results for input(s): TSH, T4TOTAL, T3FREE, THYROIDAB in the last 72 hours.  Invalid input(s): FREET3 Anemia work up No results for input(s): VITAMINB12, FOLATE, FERRITIN, TIBC, IRON, RETICCTPCT in the last 72 hours. Urinalysis    Component Value Date/Time   COLORURINE YELLOW 08/29/2021 [address] 08/29/2021 1124   LABSPEC 1.010 08/29/2021 1124   PHURINE 7.0 08/29/2021 1124   GLUCOSEU NEGATIVE 08/29/2021 1124   HGBUR TRACE (A) 08/29/2021 1124   BILIRUBINUR NEGATIVE 08/29/2021 1124   KETONESUR NEGATIVE 08/29/2021 1124   PROTEINUR NEGATIVE 08/29/2021 1124   NITRITE NEGATIVE 08/29/2021 1124   LEUKOCYTESUR NEGATIVE 08/29/2021 1124   Sepsis Labs Invalid input(s): PROCALCITONIN,  WBC,  LACTICIDVEN Microbiology Recent Results (from the past 240 hour(s))  Resp Panel by RT-PCR (Flu A&B, Covid) Nasopharyngeal Swab     Status: None   Collection Time: 08/29/21 11:24 AM   Specimen: Nasopharyngeal Swab; Nasopharyngeal(NP) swabs in vial transport medium  Result Value Ref Range Status   SARS Coronavirus 2 by RT PCR NEGATIVE NEGATIVE Final    Comment: (NOTE) SARS-CoV-2 target nucleic acids are NOT DETECTED.  The SARS-CoV-2 RNA is generally detectable in upper respiratory specimens during the acute phase of infection. The lowest concentration of SARS-CoV-2 viral copies this assay can detect is 138 copies/mL. A negative result does not preclude SARS-Cov-2 infection and should not be used as the sole basis for treatment or other patient management decisions. A negative result may occur with  improper specimen collection/handling, submission of specimen other than nasopharyngeal swab, presence of viral mutation(s) within the areas targeted by this assay, and inadequate number of viral copies(<138 copies/mL). A negative result  must be combined with clinical observations, patient history, and epidemiological information. The expected result is  Negative.  Fact Sheet for Patients:  EntrepreneurPulse.com.au  Fact Sheet for Healthcare Providers:  IncredibleEmployment.be  This test is no t yet approved or cleared by the [place] FDA and  has been authorized for detection and/or diagnosis of SARS-CoV-2 by FDA under an Emergency Use Authorization (EUA). This EUA will remain  in effect (meaning this test can be used) for the duration of the COVID-19 declaration under Section 564(b)(1) of the Act, 21 U.S.C.section 360bbb-3(b)(1), unless the authorization is terminated  or revoked sooner.       Influenza A by PCR NEGATIVE NEGATIVE Final   Influenza B by PCR NEGATIVE NEGATIVE Final    Comment: (NOTE) The Xpert Xpress SARS-CoV-2/FLU/RSV plus assay is intended as an aid in the diagnosis of influenza from Nasopharyngeal swab specimens and should not be used as a sole basis for treatment. Nasal washings and aspirates are unacceptable for Xpert Xpress SARS-CoV-2/FLU/RSV testing.  Fact Sheet for Patients: EntrepreneurPulse.com.au  Fact Sheet for Healthcare Providers: IncredibleEmployment.be  This test is not yet approved or cleared by the [place] FDA and has been authorized for detection and/or diagnosis of SARS-CoV-2 by FDA under an Emergency Use Authorization (EUA). This EUA will remain in effect (meaning this test can be used) for the duration of the COVID-19 declaration under Section 564(b)(1) of the Act, 21 U.S.C. section 360bbb-3(b)(1), unless the authorization is terminated or revoked.  Performed at [facility], [address] [place], [place] [address]   Aerobic/Anaerobic Culture w Gram Stain (surgical/deep wound)     Status: None (Preliminary result)   Collection Time: 09/04/21  9:59 AM   Specimen: Abscess   Result Value Ref Range Status   Specimen Description   Final    ABSCESS Performed at [facility] [address], [place], [place] [address]    Special Requests   Final    Normal Performed at [facility], [place] [address], [place], [place] [address]    Gram Stain   Final    RARE WBC PRESENT,BOTH PMN AND MONONUCLEAR NO ORGANISMS SEEN    Culture   Final    NO GROWTH 3 DAYS NO ANAEROBES ISOLATED; CULTURE IN PROGRESS FOR 5 DAYS Performed at [facility], [place] [address] [place], [place] [address]    Report Status PENDING  Incomplete     Time coordinating discharge: 38 minutes.   SIGNED:   Hosie Poisson, MD  [facility]

## 2021-09-10 LAB — AEROBIC/ANAEROBIC CULTURE W GRAM STAIN (SURGICAL/DEEP WOUND): Special Requests: NORMAL

## 2021-09-17 ENCOUNTER — Ambulatory Visit
Admission: RE | Admit: 2021-09-17 | Discharge: 2021-09-17 | Disposition: A | Discharge status: Home / Self Care | Payer: BC Managed Care – PPO | Source: Ambulatory Visit | Attending: Internal Medicine | Admitting: Internal Medicine

## 2021-09-17 ENCOUNTER — Other Ambulatory Visit: Payer: Self-pay | Admitting: General Surgery

## 2021-09-17 ENCOUNTER — Ambulatory Visit
Admission: RE | Admit: 2021-09-17 | Discharge: 2021-09-17 | Disposition: A | Discharge status: Home / Self Care | Payer: BC Managed Care – PPO | Source: Ambulatory Visit | Attending: General Surgery | Admitting: General Surgery

## 2021-09-17 DIAGNOSIS — N739 Female pelvic inflammatory disease, unspecified: Secondary | ICD-10-CM

## 2021-09-17 DIAGNOSIS — K5792 Diverticulitis of intestine, part unspecified, without perforation or abscess without bleeding: Secondary | ICD-10-CM

## 2021-09-17 HISTORY — PX: IR RADIOLOGIST EVAL & MGMT: IMG5224

## 2021-09-17 MED ORDER — IOPAMIDOL (ISOVUE-300) INJECTION 61%
100.0000 mL | Freq: Once | INTRAVENOUS | Status: AC | PRN
  Administered 2021-09-17: 100 mL via INTRAVENOUS

## 2021-09-17 NOTE — Progress Notes (Signed)
Chief Complaint: The patient is seen in follow up today s/p pelvic drain placement  History of present illness:  Sigmoid diverticulitis  Found to have leukocytosis with multiple pelvic abscesses.   IR placed a 10 Fr abscess drain into the posterior pelvic abscess via a right trans gluteal approach by Dr. Kathlene Cote on 09/04/21.  Pt denies fever, chills, n/v.  She is flushing daily.  She is on an antibiotic.  There is less than 10cc OP daily.  Her GI follow up was yesterday and she was advised to follow up with general surgery as well.     Past Medical History:  Diagnosis Date   Diverticulosis     No past surgical history on file.  Allergies: Patient has no known allergies.  Medications: Prior to Admission medications   Medication Sig Start Date End Date Taking? Authorizing Provider  amoxicillin-clavulanate (AUGMENTIN) 875-125 MG tablet Take 1 tablet by mouth every 12 (twelve) hours for 10 days. 09/07/21 09/17/21  Hosie Poisson, MD  bisacodyl (DULCOLAX) 10 MG suppository Place 1 suppository (10 mg total) rectally daily as needed for moderate constipation. 09/07/21   Hosie Poisson, MD  estradiol (VIVELLE-DOT) 0.025 MG/24HR Place 1 patch onto the skin 2 (two) times a week. Monday and Thursday 08/14/21   [provider]  feeding supplement (ENSURE ENLIVE / ENSURE PLUS) LIQD Take 237 mLs by mouth 2 (two) times daily between meals. 09/07/21 11/06/21  Hosie Poisson, MD  hydrocortisone cream 1 % Apply topically 4 (four) times daily. 09/07/21   Hosie Poisson, MD  levocetirizine (XYZAL) 5 MG tablet Take 5 mg by mouth every evening.    [provider]  ondansetron (ZOFRAN) 4 MG tablet Take 1 tablet (4 mg total) by mouth every 6 (six) hours as needed for nausea. 09/07/21   Hosie Poisson, MD  progesterone (PROMETRIUM) 100 MG capsule Take 100 mg by mouth at bedtime. 07/22/21   [provider]  senna-docusate (SENOKOT-S) 8.6-50 MG tablet Take 1 tablet by mouth 2 (two)  times daily as needed for mild constipation. 09/07/21   Hosie Poisson, MD  simethicone (MYLICON) 80 MG chewable tablet Chew 1 tablet (80 mg total) by mouth every 6 (six) hours as needed for flatulence. 09/07/21   Hosie Poisson, MD     Family History  Problem Relation Age of Onset   Diverticulosis Mother    Lung cancer Father     Social History   Socioeconomic History   Marital status: Married    Spouse name: Not on file   Number of children: Not on file   Years of education: Not on file   Highest education level: Not on file  Occupational History   Not on file  Tobacco Use   Smoking status: Former    Types: Cigarettes   Smokeless tobacco: Never  Vaping Use   Vaping Use: Never used  Substance and Sexual Activity   Alcohol use: Yes    Comment: SOCIALLY   Drug use: Never   Sexual activity: Yes  Other Topics Concern   Not on file  Social History Narrative   Not on file   Social Determinants of Health   Financial Resource Strain: Not on file  Food Insecurity: Not on file  Transportation Needs: Not on file  Physical Activity: Not on file  Stress: Not on file  Social Connections: Not on file    Vital Signs: There were no vitals taken for this visit.  Right Transgluteal drain --skin is intact  --insertion site  is unremarkable --drain flushes easily --scant yellow OP noted  Imaging: CT 09/17/21 Demonstrates resolution of pelvic abscess.  Labs:  CBC: Recent Labs    09/02/21 0345 09/03/21 0332 09/04/21 0329 09/06/21 0350  WBC 9.9 11.2* 10.0 6.1  HGB 10.9* 10.8* 11.1* 10.8*  HCT 32.5* 32.6* 33.4* 32.4*  PLT 215 245 275 290    COAGS: Recent Labs    08/30/21 0352  INR 1.2    BMP: Recent Labs    09/03/21 0332 09/04/21 0329 09/05/21 1754 09/06/21 0350  NA 138 138 139 141  K 3.2* 3.4* 3.4* 3.9  CL 104 102 102 104  CO2 26 27 29 30   GLUCOSE 101* 101* 117* 88  BUN <5* 5* 6* 5*  CALCIUM 8.0* 8.1* 8.6* 8.4*  CREATININE 0.77 0.68 0.83 0.76   GFRNONAA >60 >60 >60 >60    LIVER FUNCTION TESTS: Recent Labs    08/29/21 1124 08/30/21 0352  BILITOT 0.6 0.9  AST 17 13*  ALT 16 14  ALKPHOS 49 39  PROT 7.4 6.3*  ALBUMIN 3.9 3.2*    Assessment:  Diverticulitis with pelvic abscess --has had drain in place for almost two weeks, CT suggests abscess space resolution --no clinical indication of ongoing infection/illness, complete antibiotic course --contrast injection suggests communication with adjacent bowel and right ovary --Return to drain clinic for injection in two weeks. --keep follow up appointments with GI and Surgery.    SignedPasty Spillers, PA 09/17/2021, 1:54 PM   Please refer to Dr. Annamaria Boots attestation of this note for management and plan.

## 2021-09-29 ENCOUNTER — Ambulatory Visit
Admission: RE | Admit: 2021-09-29 | Discharge: 2021-09-29 | Disposition: A | Discharge status: Home / Self Care | Payer: BC Managed Care – PPO | Source: Ambulatory Visit | Attending: General Surgery | Admitting: General Surgery

## 2021-09-29 ENCOUNTER — Other Ambulatory Visit (HOSPITAL_COMMUNITY): Payer: Self-pay | Admitting: Interventional Radiology

## 2021-09-29 ENCOUNTER — Other Ambulatory Visit: Payer: Self-pay | Admitting: Radiology

## 2021-09-29 DIAGNOSIS — N739 Female pelvic inflammatory disease, unspecified: Secondary | ICD-10-CM

## 2021-09-29 DIAGNOSIS — K5792 Diverticulitis of intestine, part unspecified, without perforation or abscess without bleeding: Secondary | ICD-10-CM

## 2021-09-29 HISTORY — PX: IR RADIOLOGIST EVAL & MGMT: IMG5224

## 2021-09-29 NOTE — Progress Notes (Signed)
Patient ID: Jaclyn York, female   DOB: 11/26/1955, 65 y.o.   MRN: [id number]        Chief Complaint: Diverticular abscess, post percutaneous drainage catheter placement.  Referring Physician(s): Ramirez,Armando  History of Present Illness: Jaclyn York is a 65 y.o. female who is status postplacement of a right transgluteal approach diverticular abscess drainage catheter on 09/04/2021 (by Dr. Kathlene Cote). She presents today for drainage catheter injection, evaluation and management.  Patient is not flushing the percutaneous catheter and reports little to no output from drainage catheter for the past several days.    She does report difficulty defecating with intermittent abdominal and low back pain which she is uncertain if it is attributable to her diverticular disease.  She denies fever or chills.  Past Medical History:  Diagnosis Date   Diverticulosis     Past Surgical History:  Procedure Laterality Date   IR RADIOLOGIST EVAL & MGMT  09/17/2021    Allergies: Patient has no known allergies.  Medications: Prior to Admission medications   Medication Sig Start Date End Date Taking? Authorizing Provider  bisacodyl (DULCOLAX) 10 MG suppository Place 1 suppository (10 mg total) rectally daily as needed for moderate constipation. 09/07/21   Hosie Poisson, MD  estradiol (VIVELLE-DOT) 0.025 MG/24HR Place 1 patch onto the skin 2 (two) times a week. Monday and Thursday 08/14/21   [provider]  feeding supplement (ENSURE ENLIVE / ENSURE PLUS) LIQD Take 237 mLs by mouth 2 (two) times daily between meals. 09/07/21 11/06/21  Hosie Poisson, MD  hydrocortisone cream 1 % Apply topically 4 (four) times daily. 09/07/21   Hosie Poisson, MD  levocetirizine (XYZAL) 5 MG tablet Take 5 mg by mouth every evening.    [provider]  ondansetron (ZOFRAN) 4 MG tablet Take 1 tablet (4 mg total) by mouth every 6 (six) hours as needed for nausea. 09/07/21   Hosie Poisson, MD   progesterone (PROMETRIUM) 100 MG capsule Take 100 mg by mouth at bedtime. 07/22/21   [provider]  senna-docusate (SENOKOT-S) 8.6-50 MG tablet Take 1 tablet by mouth 2 (two) times daily as needed for mild constipation. 09/07/21   Hosie Poisson, MD  simethicone (MYLICON) 80 MG chewable tablet Chew 1 tablet (80 mg total) by mouth every 6 (six) hours as needed for flatulence. 09/07/21   Hosie Poisson, MD     Family History  Problem Relation Age of Onset   Diverticulosis Mother    Lung cancer Father     Social History   Socioeconomic History   Marital status: Married    Spouse name: Not on file   Number of children: Not on file   Years of education: Not on file   Highest education level: Not on file  Occupational History   Not on file  Tobacco Use   Smoking status: Former    Types: Cigarettes   Smokeless tobacco: Never  Vaping Use   Vaping Use: Never used  Substance and Sexual Activity   Alcohol use: Yes    Comment: SOCIALLY   Drug use: Never   Sexual activity: Yes  Other Topics Concern   Not on file  Social History Narrative   Not on file   Social Determinants of Health   Financial Resource Strain: Not on file  Food Insecurity: Not on file  Transportation Needs: Not on file  Physical Activity: Not on file  Stress: Not on file  Social Connections: Not on file    ECOG Status: 1 - Symptomatic but  completely ambulatory  Review of Systems: A 12 point ROS discussed and pertinent positives are indicated in the HPI above.  All other systems are negative.  Review of Systems  Vital Signs: There were no vitals taken for this visit.  Physical Exam  Mallampati Score:     Imaging:  The following examinations were reviewed in detail: CT abdomen and pelvis-09/17/2021; 09/03/2021; 08/29/2021 CT-guided percutaneous catheter placement-09/04/2021 image guided drainage catheter injection-09/17/2021   CT ABDOMEN PELVIS W CONTRAST  Result Date:  09/17/2021 CLINICAL DATA:  Pelvic diverticular abscess, status post percutaneous drain 09/04/2021 EXAM: CT ABDOMEN AND PELVIS WITH CONTRAST TECHNIQUE: Multidetector CT imaging of the abdomen and pelvis was performed using the standard protocol following bolus administration of intravenous contrast. CONTRAST:  168mL ISOVUE-300 IOPAMIDOL (ISOVUE-300) INJECTION 61% COMPARISON:  09/04/2021, 09/03/2021 FINDINGS: Lower chest: No acute abnormality. Hepatobiliary: Focal fatty infiltration of the liver along the falciform ligament anteriorly. No other significant hepatic abnormality. Hepatic and portal veins are patent. No biliary obstruction pattern. Gallbladder unremarkable. Common bile duct nondilated. Pancreas: Unremarkable. No pancreatic ductal dilatation or surrounding inflammatory changes. Spleen: Normal in size without focal abnormality. Adrenals/Urinary Tract: Adrenal glands are unremarkable. Kidneys are normal, without renal calculi, focal lesion, or hydronephrosis. Bladder is unremarkable. Stomach/Bowel: Negative for bowel obstruction, significant dilatation, ileus, or free air. Normal retrocecal appendix. Resolution of the sigmoid diverticulitis. Complex pelvic abscess has resolved following right trans gluteal percutaneous drain. No residual pelvic fluid collections. No new abdominopelvic fluid collection appreciated. Vascular/Lymphatic: Aortic atherosclerosis. Negative for aneurysm or dissection. Mesenteric and renal vasculature appear patent. No veno-occlusive process. No bulky adenopathy. Reproductive: Uterus and adnexa normal in size. Other: No abdominal wall hernia or abnormality. No abdominopelvic ascites. Musculoskeletal: Degenerative changes throughout the spine. Lower lumbar facet arthropathy. IMPRESSION: Resolved sigmoid diverticulitis and complex pelvic abscess following right trans gluteal pelvic abscess drain. Electronically Signed   By: Jerilynn Mages.  Shick M.D.   On: 09/17/2021 13:54   CT ABDOMEN PELVIS W  CONTRAST  Result Date: 09/03/2021 CLINICAL DATA:  Follow-up sigmoid diverticulitis. Patient with increasing pain, fever and rising white blood cell count. EXAM: CT ABDOMEN AND PELVIS WITH CONTRAST TECHNIQUE: Multidetector CT imaging of the abdomen and pelvis was performed using the standard protocol following bolus administration of intravenous contrast. CONTRAST:  15mL OMNIPAQUE IOHEXOL 350 MG/ML SOLN COMPARISON:  CT scan 08/29/2021 FINDINGS: Lower chest: New small bilateral pleural effusions and overlying bibasilar atelectasis. Hepatobiliary: No hepatic lesions or intrahepatic biliary dilatation. The gallbladder is unremarkable. No common bile duct dilatation. Pancreas: No mass, inflammation or ductal dilatation. Spleen: Normal size.  No focal lesions. Adrenals/Urinary Tract: Adrenal glands and kidneys are unremarkable and stable. Stable left-sided parapelvic renal cyst. The bladder is grossly. Stomach/Bowel: The stomach, duodenum and small bowel are unremarkable. No inflammatory changes or obstructive findings. The terminal ileum is normal. The appendix is. Persistent changes of acute diverticulitis involving the mid to lower sigmoid colon. There is now a more clearly defined intramural abscess measuring 2.8 x 2.3 cm. There are also multiple pelvic abscesses, new since the prior study. The largest abscess is in the cul-de-sac region and measures 7.6 x 5.5 cm. Anterior to the uterus there is a smaller abscess measuring 4.3 cm. This may communicate with the cul-de-sac abscess when seen on the sagittal images (image 65/7). In the left adnexal region there is a small abscess measuring 2.3 cm. Vascular/Lymphatic: Stable aortic and iliac artery calcifications. No aneurysm or dissection. The branch vessels are patent. The major venous structures are patent. No mesenteric  or retroperitoneal mass or adenopathy. There are small likely reactive pelvic sidewall nodes Reproductive: The uterus and ovaries are surrounded by  inflammation abscesses. Other: No inguinal mass or hernia.  No subcutaneous lesions. Musculoskeletal: No significant bony findings. IMPRESSION: 1. Persistent changes of acute diverticulitis involving the mid to lower sigmoid colon. 2. Multiple new pelvic abscesses as detailed above. 3. New small bilateral pleural effusions and overlying bibasilar atelectasis. 4. Aortic atherosclerosis. These results will be called to the ordering clinician or representative by the Radiologist Assistant, and communication documented in the PACS or [employer]. Aortic Atherosclerosis (ICD10-I70.0). Electronically Signed   By: Marijo Sanes M.D.   On: 09/03/2021 14:20   DG Sinus/Fist Tube Chk-Non GI  Result Date: 09/17/2021 INDICATION: Diverticular abscess, status post percutaneous drain EXAM: FLUOROSCOPIC INJECTION OF THE PELVIC DIVERTICULAR ABSCESS DRAIN MEDICATIONS: NONE. Fluoroscopy: 24 seconds, dose: 6 mGy. CONTRAST:  10 cc Isovue-300 ANESTHESIA/SEDATION: None. COMPLICATIONS: None immediate. PROCEDURE: Existing trans gluteal pelvic abscess drain was injected with contrast. Fluoroscopic imaging performed. Stable drain catheter position compared to the CT. There is a small collapsed abscess cavity at the drain catheter site. There is an adjacent small fistula to a diverticulum of the sigmoid colon. There is also a fistula to the right fallopian tube. Please note patient is in a prone position. IMPRESSION: Stable drain catheter position. 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 PLAN: Discontinue drain flushing and exchange of suction bulb for gravity drainage. Repeat injection in 2 weeks. Electronically Signed   By: Jerilynn Mages.  Shick M.D.   On: 09/17/2021 14:38   CT IMAGE GUIDED DRAINAGE BY PERCUTANEOUS CATHETER  Result Date: 09/04/2021 CLINICAL DATA:  Sigmoid diverticulitis and development of multiple pelvic abscess fluid collections  including a large posterior deep pelvic abscess. EXAM: CT GUIDED CATHETER DRAINAGE OF PELVIC PERITONEAL ABSCESS ANESTHESIA/SEDATION: Moderate (conscious) sedation was employed during this procedure. A total of Versed 2.0 mg and Fentanyl 125 mcg was administered intravenously by radiology nursing under my supervision. Moderate Sedation Time: 22 minutes. The patient's level of consciousness and vital signs were monitored continuously by radiology nursing throughout the procedure under my direct supervision. PROCEDURE: The procedure, risks, benefits, and alternatives were explained to the patient. Questions regarding the procedure were encouraged and answered. The patient understands and consents to the procedure. A time out was performed prior to initiating the procedure. CT was performed in a prone position to localize a posterior pelvic abscess. The right gluteal region was prepped with chlorhexidine in a sterile fashion, and a sterile drape was applied covering the operative field. A sterile gown and sterile gloves were used for the procedure. Local anesthesia was provided with 1% Lidocaine. An 18 gauge trocar needle was advanced under CT guidance via a right posterior transgluteal approach into a posterior pelvic abscess fluid collection. After confirming needle tip position, the needle was removed over a guidewire. The percutaneous tract was dilated and a 10 French percutaneous drain placed. A fluid sample was withdrawn and sent for culture analysis. CT was performed to confirm catheter position. The drain was attached to suction bulb drainage and secured at the skin with a Prolene retention suture and StatLock device. COMPLICATIONS: None FINDINGS: After placement of a drainage catheter into the dominant posterior pelvic abscess, there was return thin, slightly turbid serosanguineous fluid. A sample was sent for culture analysis. IMPRESSION: CT-guided percutaneous catheter drainage of posterior pelvic abscess via  a right transgluteal approach.  There was return of thin, slightly turbid serosanguineous fluid. A sample was sent for culture analysis. A 10 French drain was placed and attached to suction bulb drainage. Electronically Signed   By: Aletta Edouard M.D.   On: 09/04/2021 10:34   IR Radiologist Eval & Mgmt  Result Date: 09/17/2021 Please refer to notes tab for details about interventional procedure. (Op Note)   Labs:  CBC: Recent Labs    09/02/21 0345 09/03/21 0332 09/04/21 0329 09/06/21 0350  WBC 9.9 11.2* 10.0 6.1  HGB 10.9* 10.8* 11.1* 10.8*  HCT 32.5* 32.6* 33.4* 32.4*  PLT 215 245 275 290    COAGS: Recent Labs    08/30/21 0352  INR 1.2    BMP: Recent Labs    09/03/21 0332 09/04/21 0329 09/05/21 1754 09/06/21 0350  NA 138 138 139 141  K 3.2* 3.4* 3.4* 3.9  CL 104 102 102 104  CO2 26 27 29 30   GLUCOSE 101* 101* 117* 88  BUN <5* 5* 6* 5*  CALCIUM 8.0* 8.1* 8.6* 8.4*  CREATININE 0.77 0.68 0.83 0.76  GFRNONAA >60 >60 >60 >60    LIVER FUNCTION TESTS: Recent Labs    08/29/21 1124 08/30/21 0352  BILITOT 0.6 0.9  AST 17 13*  ALT 16 14  ALKPHOS 49 39  PROT 7.4 6.3*  ALBUMIN 3.9 3.2*    TUMOR MARKERS: No results for input(s): AFPTM, CEA, CA199, CHROMGRNA in the last 8760 hours.  Assessment and Plan:  Jaclyn York is a 65 y.o. female who is status postplacement of a right transgluteal approach diverticular abscess drainage catheter on 09/04/2021 (by Dr. Kathlene Cote). She presents today for drainage catheter injection, evaluation and management.  Patient is not flushing the percutaneous catheter and reports little to no output from drainage catheter for the past several days.    She does report difficulty defecating with intermittent abdominal and low back pain which she is uncertain if it is attributable to her diverticular disease.  She denies fever or chills.  The following examinations were reviewed in detail: CT abdomen and pelvis-09/17/2021;  09/03/2021; 08/29/2021 CT-guided percutaneous catheter placement-09/04/2021 image guided drainage catheter injection-09/17/2021  Personal review of CT scan of the abdomen pelvis performed 09/17/2021 it demonstrates resolution of the diverticular abscess following right transgluteal approach drainage catheter injection.  Subsequent drainage catheter injection performed on the same day demonstrated opacification of decompressed abscess cavity with communication between the decompressed abscess cavity and the adjacent sigmoid colon.  Drainage catheter performed today demonstrates abrupt kinking of the mid aspect of the drainage catheter which results in personal drainage catheter occlusion.  Ultimately, there is opacification of the decompressed abscess cavity with communication between the decompressed abscess cavity and the adjacent sigmoid colon.  Additionally, there is extravasation of contrast opacifying the external portions of the colon within the left lower abdomen.  Given above, decision was made to proceed with fluoroscopic guided drainage catheter exchange and upsizing, to be performed later this week at [facility] with conscious sedation (per patient request).  Plan: - The patient is instructed to maintain current management of the drainage catheter which includes maintaining diligent records regarding with daily drainage catheter output and no longer flushing the drainage catheter.  The drainage catheter will be maintained to a noncharged JP bulb (per patient request as she does not like the size of the gravity bag). 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 Rosendo Gros.  A copy of this report was sent to the requesting provider on this date.  Electronically Signed: Sandi Mariscal 09/29/2021, 11:17 AM   I spent a total of 10 Minutes in face to face in clinical consultation, greater than 50% of which was counseling/coordinating care for drainage catheter evaluation and management.

## 2021-09-30 ENCOUNTER — Ambulatory Visit (HOSPITAL_COMMUNITY)
Admission: RE | Admit: 2021-09-30 | Discharge: 2021-09-30 | Disposition: A | Discharge status: Home / Self Care | Payer: BC Managed Care – PPO | Source: Ambulatory Visit | Attending: Interventional Radiology | Admitting: Interventional Radiology

## 2021-09-30 ENCOUNTER — Telehealth: Payer: Self-pay | Admitting: Gastroenterology

## 2021-09-30 DIAGNOSIS — K5792 Diverticulitis of intestine, part unspecified, without perforation or abscess without bleeding: Secondary | ICD-10-CM | POA: Insufficient documentation

## 2021-09-30 DIAGNOSIS — N739 Female pelvic inflammatory disease, unspecified: Secondary | ICD-10-CM | POA: Insufficient documentation

## 2021-09-30 HISTORY — PX: IR CATHETER TUBE CHANGE: IMG717

## 2021-09-30 LAB — CBC
HCT: 41 % (ref 36.0–46.0)
Hemoglobin: 13.8 g/dL (ref 12.0–15.0)
MCH: 31.2 pg (ref 26.0–34.0)
MCHC: 33.7 g/dL (ref 30.0–36.0)
MCV: 92.8 fL (ref 80.0–100.0)
Platelets: 168 10*3/uL (ref 150–400)
RBC: 4.42 MIL/uL (ref 3.87–5.11)
RDW: 12.6 % (ref 11.5–15.5)
WBC: 8.6 10*3/uL (ref 4.0–10.5)
nRBC: 0 % (ref 0.0–0.2)

## 2021-09-30 LAB — PROTIME-INR
INR: 1 (ref 0.8–1.2)
Prothrombin Time: 13.6 seconds (ref 11.4–15.2)

## 2021-09-30 MED ORDER — MIDAZOLAM HCL 2 MG/2ML IJ SOLN
INTRAMUSCULAR | Status: DC | PRN
  Administered 2021-09-30: 1 mg via INTRAVENOUS
  Administered 2021-09-30: .5 mg via INTRAVENOUS

## 2021-09-30 MED ORDER — IOHEXOL 300 MG/ML  SOLN
50.0000 mL | Freq: Once | INTRAMUSCULAR | Status: AC | PRN
  Administered 2021-09-30: 09:00:00 10 mL

## 2021-09-30 MED ORDER — SODIUM CHLORIDE 0.9 % IV SOLN: INTRAVENOUS | Status: DC

## 2021-09-30 MED ORDER — LIDOCAINE HCL 1 % IJ SOLN
INTRAMUSCULAR | Status: AC
  Filled 2021-09-30: qty 20

## 2021-09-30 MED ORDER — FENTANYL CITRATE (PF) 100 MCG/2ML IJ SOLN
INTRAMUSCULAR | Status: AC
  Filled 2021-09-30: qty 4

## 2021-09-30 MED ORDER — MIDAZOLAM HCL 2 MG/2ML IJ SOLN
INTRAMUSCULAR | Status: AC
  Filled 2021-09-30: qty 2

## 2021-09-30 MED ORDER — FENTANYL CITRATE (PF) 100 MCG/2ML IJ SOLN
INTRAMUSCULAR | Status: DC | PRN
  Administered 2021-09-30 (×3): 25 ug via INTRAVENOUS

## 2021-09-30 NOTE — H&P (Signed)
Chief Complaint: Patient was seen in consultation today for drain exchange/upsize  Referring Physician(s): Ralene Ok, MD  Supervising Physician: Sandi Mariscal  Patient Status: [facility] - Out-pt  History of Present Illness: Jaclyn York is a 65 y.o. female with a past medical history significant for diverticulitis with abscess formation s/p right transgluteal diverticular abscess drain placement 09/04/21 by Dr. Kathlene Cote who presents today for drain exchange/upsize with moderate sedation. 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 She presents today for drain exchange and upsize due to persistent fistula and catheter occlusion.  Jaclyn York reports feeling very poorly after drain injection yesterday, she states that she had severe pain at the drain insertion site all night last night - she had some relief from narcotic pain medication but this only lasted about 2 hours. She also experienced nausea, vomiting and feeling faint. This did not occur during her previous drain injection. She is still having pain today but denies n/v/syncope. She also notes that the fluid in her bag as been bloody since drain injection yesterday. She understands the procedure today and is agreeable to proceed as planned.  Past Medical History:  Diagnosis Date   Diverticulosis     Past Surgical History:  Procedure Laterality Date   IR RADIOLOGIST EVAL & MGMT  09/17/2021   IR RADIOLOGIST EVAL & MGMT  09/29/2021    Allergies: Patient has no known allergies.  Medications: Prior to Admission medications   Medication Sig Start Date End Date Taking? Authorizing Provider  ondansetron (ZOFRAN) 4 MG tablet Take 1 tablet (4 mg total) by mouth every 6 (six) hours as needed for nausea. 09/07/21  Yes Hosie Poisson, MD  senna-docusate  (SENOKOT-S) 8.6-50 MG tablet Take 1 tablet by mouth 2 (two) times daily as needed for mild constipation. 09/07/21  Yes Hosie Poisson, MD  bisacodyl (DULCOLAX) 10 MG suppository Place 1 suppository (10 mg total) rectally daily as needed for moderate constipation. 09/07/21   Hosie Poisson, MD  estradiol (VIVELLE-DOT) 0.025 MG/24HR Place 1 patch onto the skin 2 (two) times a week. Monday and Thursday 08/14/21   [provider]  feeding supplement (ENSURE ENLIVE / ENSURE PLUS) LIQD Take 237 mLs by mouth 2 (two) times daily between meals. 09/07/21 11/06/21  Hosie Poisson, MD  hydrocortisone cream 1 % Apply topically 4 (four) times daily. 09/07/21   Hosie Poisson, MD  levocetirizine (XYZAL) 5 MG tablet Take 5 mg by mouth every evening.    [provider]  progesterone (PROMETRIUM) 100 MG capsule Take 100 mg by mouth at bedtime. 07/22/21   [provider]  simethicone (MYLICON) 80 MG chewable tablet Chew 1 tablet (80 mg total) by mouth every 6 (six) hours as needed for flatulence. 09/07/21   Hosie Poisson, MD     Family History  Problem Relation Age of Onset   Diverticulosis Mother    Lung cancer Father     Social History   Socioeconomic History   Marital status: Married    Spouse name: Not on file   Number of children: Not on file   Years of education: Not on file   Highest education level: Not on file  Occupational History   Not on file  Tobacco Use   Smoking status: Former    Types: Cigarettes   Smokeless tobacco: Never  Vaping  Use   Vaping Use: Never used  Substance and Sexual Activity   Alcohol use: Yes    Comment: SOCIALLY   Drug use: Never   Sexual activity: Yes  Other Topics Concern   Not on file  Social History Narrative   Not on file   Social Determinants of Health   Financial Resource Strain: Not on file  Food Insecurity: Not on file  Transportation Needs: Not on file  Physical Activity: Not on file  Stress: Not on file  Social  Connections: Not on file     Review of Systems: A 12 point ROS discussed and pertinent positives are indicated in the HPI above.  All other systems are negative.  Review of Systems  Constitutional:  Negative for chills and fever.  Respiratory:  Negative for cough and shortness of breath.   Cardiovascular:  Negative for chest pain.  Gastrointestinal:  Negative for abdominal pain, nausea and vomiting.       (+) pain at right TG drain insertion site  Musculoskeletal:  Negative for back pain.  Neurological:  Negative for dizziness and headaches.   Vital Signs: BP 123/65    Pulse 78    Temp 98.9 F (37.2 C) (Oral)    Resp 18    SpO2 98%   Physical Exam Vitals reviewed.  Constitutional:      General: She is not in acute distress. HENT:     Head: Normocephalic.     Mouth/Throat:     Mouth: Mucous membranes are moist.     Pharynx: Oropharynx is clear. No oropharyngeal exudate or posterior oropharyngeal erythema.  Cardiovascular:     Rate and Rhythm: Normal rate and regular rhythm.  Pulmonary:     Effort: Pulmonary effort is normal.     Breath sounds: Normal breath sounds.  Abdominal:     General: There is no distension.     Palpations: Abdomen is soft.     Tenderness: There is no abdominal tenderness.     Comments: (+) right TG drain to gravity with scant bloody output in bag  Skin:    General: Skin is warm and dry.  Neurological:     Mental Status: She is alert and oriented to person, place, and time.  Psychiatric:        Mood and Affect: Mood normal.        Behavior: Behavior normal.        Thought Content: Thought content normal.        Judgment: Judgment normal.     MD Evaluation Airway: WNL Heart: WNL Abdomen: WNL Chest/ Lungs: WNL ASA  Classification: 2 Mallampati/Airway Score: One   Imaging: CT ABDOMEN PELVIS W CONTRAST  Result Date: 09/17/2021 CLINICAL DATA:  Pelvic diverticular abscess, status post percutaneous drain 09/04/2021 EXAM: CT ABDOMEN AND  PELVIS WITH CONTRAST TECHNIQUE: Multidetector CT imaging of the abdomen and pelvis was performed using the standard protocol following bolus administration of intravenous contrast. CONTRAST:  115mL ISOVUE-300 IOPAMIDOL (ISOVUE-300) INJECTION 61% COMPARISON:  09/04/2021, 09/03/2021 FINDINGS: Lower chest: No acute abnormality. Hepatobiliary: Focal fatty infiltration of the liver along the falciform ligament anteriorly. No other significant hepatic abnormality. Hepatic and portal veins are patent. No biliary obstruction pattern. Gallbladder unremarkable. Common bile duct nondilated. Pancreas: Unremarkable. No pancreatic ductal dilatation or surrounding inflammatory changes. Spleen: Normal in size without focal abnormality. Adrenals/Urinary Tract: Adrenal glands are unremarkable. Kidneys are normal, without renal calculi, focal lesion, or hydronephrosis. Bladder is unremarkable. Stomach/Bowel: Negative for bowel obstruction, significant dilatation, ileus,  or free air. Normal retrocecal appendix. Resolution of the sigmoid diverticulitis. Complex pelvic abscess has resolved following right trans gluteal percutaneous drain. No residual pelvic fluid collections. No new abdominopelvic fluid collection appreciated. Vascular/Lymphatic: Aortic atherosclerosis. Negative for aneurysm or dissection. Mesenteric and renal vasculature appear patent. No veno-occlusive process. No bulky adenopathy. Reproductive: Uterus and adnexa normal in size. Other: No abdominal wall hernia or abnormality. No abdominopelvic ascites. Musculoskeletal: Degenerative changes throughout the spine. Lower lumbar facet arthropathy. IMPRESSION: Resolved sigmoid diverticulitis and complex pelvic abscess following right trans gluteal pelvic abscess drain. Electronically Signed   By: Jerilynn Mages.  Shick M.D.   On: 09/17/2021 13:54   CT ABDOMEN PELVIS W CONTRAST  Result Date: 09/03/2021 CLINICAL DATA:  Follow-up sigmoid diverticulitis. Patient with increasing pain,  fever and rising white blood cell count. EXAM: CT ABDOMEN AND PELVIS WITH CONTRAST TECHNIQUE: Multidetector CT imaging of the abdomen and pelvis was performed using the standard protocol following bolus administration of intravenous contrast. CONTRAST:  48mL OMNIPAQUE IOHEXOL 350 MG/ML SOLN COMPARISON:  CT scan 08/29/2021 FINDINGS: Lower chest: New small bilateral pleural effusions and overlying bibasilar atelectasis. Hepatobiliary: No hepatic lesions or intrahepatic biliary dilatation. The gallbladder is unremarkable. No common bile duct dilatation. Pancreas: No mass, inflammation or ductal dilatation. Spleen: Normal size.  No focal lesions. Adrenals/Urinary Tract: Adrenal glands and kidneys are unremarkable and stable. Stable left-sided parapelvic renal cyst. The bladder is grossly. Stomach/Bowel: The stomach, duodenum and small bowel are unremarkable. No inflammatory changes or obstructive findings. The terminal ileum is normal. The appendix is. Persistent changes of acute diverticulitis involving the mid to lower sigmoid colon. There is now a more clearly defined intramural abscess measuring 2.8 x 2.3 cm. There are also multiple pelvic abscesses, new since the prior study. The largest abscess is in the cul-de-sac region and measures 7.6 x 5.5 cm. Anterior to the uterus there is a smaller abscess measuring 4.3 cm. This may communicate with the cul-de-sac abscess when seen on the sagittal images (image 65/7). In the left adnexal region there is a small abscess measuring 2.3 cm. Vascular/Lymphatic: Stable aortic and iliac artery calcifications. No aneurysm or dissection. The branch vessels are patent. The major venous structures are patent. No mesenteric or retroperitoneal mass or adenopathy. There are small likely reactive pelvic sidewall nodes Reproductive: The uterus and ovaries are surrounded by inflammation abscesses. Other: No inguinal mass or hernia.  No subcutaneous lesions. Musculoskeletal: No significant  bony findings. IMPRESSION: 1. Persistent changes of acute diverticulitis involving the mid to lower sigmoid colon. 2. Multiple new pelvic abscesses as detailed above. 3. New small bilateral pleural effusions and overlying bibasilar atelectasis. 4. Aortic atherosclerosis. These results will be called to the ordering clinician or representative by the Radiologist Assistant, and communication documented in the PACS or [employer]. Aortic Atherosclerosis (ICD10-I70.0). Electronically Signed   By: Marijo Sanes M.D.   On: 09/03/2021 14:20   DG Sinus/Fist Tube Chk-Non GI  Result Date: 09/29/2021 CLINICAL DATA:  History diverticular abscess, post percutaneous drainage catheter placement on 09/04/2021. EXAM: ABSCESS INJECTION COMPARISON:  CT abdomen and pelvis-09/17/2021; 09/03/2021 CT-guided percutaneous catheter placement-09/04/2021 Drainage catheter injection-09/17/2021 CONTRAST:  20 cc Isovue-300 - administered via the existing percutaneous drain. FLUOROSCOPY TIME:  2 minutes, 6 seconds (44.7 mGy) TECHNIQUE: The patient was positioned prone on the fluoroscopy table. A preprocedural spot fluoroscopic image was obtained of the right-sided trans gluteal approach and the existing percutaneous drainage catheter. Multiple spot fluoroscopic and radiographic images were obtained following the injection of a small amount  of contrast via the existing percutaneous drainage catheter. Images were reviewed and discussed with the patient. The drainage catheter was flushed with a small amount of saline and connected to a non charge JP bulb (per patient request as the gravity bag is too cumbersome for the patient to manage). A dressing was applied. Patient did experience discomfort with injection of the drain as well as following drainage catheter injection however otherwise tolerated the procedure well without immediate postprocedural complication. FINDINGS: Preprocedural spot fluoroscopic image demonstrates unchanged  positioning of right trans gluteal approach drainage catheter with end coiled and locked over the midline of the pelvis. There is an abrupt kink of the mid aspect of the drainage catheter regional to the skin entrance surface site, similar to preceding abdominal CT Contrast injection initially proved challenging secondary to the kinking of the mid aspect of the drainage catheter however ultimately the decompressed abscess cavity was opacified with faint opacification of the adjacent sigmoid colon indicative of a persistent fistula. Additionally, there is extravasation of contrast to opacify the outer aspects of the adjacent sigmoid colon. 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 PLAN: - The patient was instructed to maintain current management of the drainage catheter including maintaining diligent records regarding drainage catheter output and no longer flushing the drainage catheter. - The drainage catheter was converted from a gravity bag to a non charge JP bulb as the gravity bag is to conversation for the panic session to manage - The patient will be scheduled for a fluoroscopic guided drainage catheter exchange and up sizing secondary to persistence of the enteric fistula as well as kinking of the mid aspect of the catheter resulting in partial catheter occlusion. Electronically Signed   By: Sandi Mariscal M.D.   On: 09/29/2021 13:38   DG Sinus/Fist Tube Chk-Non GI  Result Date: 09/17/2021 INDICATION: Diverticular abscess, status post percutaneous drain EXAM: FLUOROSCOPIC INJECTION OF THE PELVIC DIVERTICULAR ABSCESS DRAIN MEDICATIONS: NONE. Fluoroscopy: 24 seconds, dose: 6 mGy. CONTRAST:  10 cc Isovue-300 ANESTHESIA/SEDATION: None. COMPLICATIONS: None immediate. PROCEDURE: Existing trans gluteal pelvic abscess drain was injected with contrast. Fluoroscopic imaging performed. Stable drain catheter  position compared to the CT. There is a small collapsed abscess cavity at the drain catheter site. There is an adjacent small fistula to a diverticulum of the sigmoid colon. There is also a fistula to the right fallopian tube. Please note patient is in a prone position. IMPRESSION: Stable drain catheter position. 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 PLAN: Discontinue drain flushing and exchange of suction bulb for gravity drainage. Repeat injection in 2 weeks. Electronically Signed   By: Jerilynn Mages.  Shick M.D.   On: 09/17/2021 14:38   CT IMAGE GUIDED DRAINAGE BY PERCUTANEOUS CATHETER  Result Date: 09/04/2021 CLINICAL DATA:  Sigmoid diverticulitis and development of multiple pelvic abscess fluid collections including a large posterior deep pelvic abscess. EXAM: CT GUIDED CATHETER DRAINAGE OF PELVIC PERITONEAL ABSCESS ANESTHESIA/SEDATION: Moderate (conscious) sedation was employed during this procedure. A total of Versed 2.0 mg and Fentanyl 125 mcg was administered intravenously by radiology nursing under my supervision. Moderate Sedation Time: 22 minutes. The patient's level of consciousness and vital signs were monitored continuously by radiology nursing throughout the procedure under my direct supervision. PROCEDURE: The procedure, risks, benefits, and alternatives were explained to the patient. Questions regarding the procedure were encouraged and answered. The  patient understands and consents to the procedure. A time out was performed prior to initiating the procedure. CT was performed in a prone position to localize a posterior pelvic abscess. The right gluteal region was prepped with chlorhexidine in a sterile fashion, and a sterile drape was applied covering the operative field. A sterile gown and sterile gloves were used for the procedure. Local anesthesia was provided with 1% Lidocaine. An 18 gauge trocar needle was  advanced under CT guidance via a right posterior transgluteal approach into a posterior pelvic abscess fluid collection. After confirming needle tip position, the needle was removed over a guidewire. The percutaneous tract was dilated and a 10 French percutaneous drain placed. A fluid sample was withdrawn and sent for culture analysis. CT was performed to confirm catheter position. The drain was attached to suction bulb drainage and secured at the skin with a Prolene retention suture and StatLock device. COMPLICATIONS: None FINDINGS: After placement of a drainage catheter into the dominant posterior pelvic abscess, there was return thin, slightly turbid serosanguineous fluid. A sample was sent for culture analysis. IMPRESSION: CT-guided percutaneous catheter drainage of posterior pelvic abscess via a right transgluteal approach. There was return of thin, slightly turbid serosanguineous fluid. A sample was sent for culture analysis. A 10 French drain was placed and attached to suction bulb drainage. Electronically Signed   By: Aletta Edouard M.D.   On: 09/04/2021 10:34   IR Radiologist Eval & Mgmt  Result Date: 09/29/2021 Please refer to notes tab for details about interventional procedure. (Op Note)  IR Radiologist Eval & Mgmt  Result Date: 09/17/2021 Please refer to notes tab for details about interventional procedure. (Op Note)   Labs:  CBC: Recent Labs    09/03/21 0332 09/04/21 0329 09/06/21 0350 09/30/21 0724  WBC 11.2* 10.0 6.1 8.6  HGB 10.8* 11.1* 10.8* 13.8  HCT 32.6* 33.4* 32.4* 41.0  PLT 245 275 290 168    COAGS: Recent Labs    08/30/21 0352  INR 1.2    BMP: Recent Labs    09/03/21 0332 09/04/21 0329 09/05/21 1754 09/06/21 0350  NA 138 138 139 141  K 3.2* 3.4* 3.4* 3.9  CL 104 102 102 104  CO2 26 27 29 30   GLUCOSE 101* 101* 117* 88  BUN <5* 5* 6* 5*  CALCIUM 8.0* 8.1* 8.6* 8.4*  CREATININE 0.77 0.68 0.83 0.76  GFRNONAA >60 >60 >60 >60    LIVER FUNCTION  TESTS: Recent Labs    08/29/21 1124 08/30/21 0352  BILITOT 0.6 0.9  AST 17 13*  ALT 16 14  ALKPHOS 49 39  PROT 7.4 6.3*  ALBUMIN 3.9 3.2*    TUMOR MARKERS: No results for input(s): AFPTM, CEA, CA199, CHROMGRNA in the last 8760 hours.  Assessment and Plan:  65 y/o F with history of diverticular abscess s/p right TG drain placement 09/04/21 with Dr. Kathlene Cote who presents today for drain exchange and upsize due to catheter occlusion and persistent fistula noted on yesterday's drain injection.  Risks and benefits discussed with the patient including bleeding, infection, damage to adjacent structures, bowel perforation/fistula connection, and sepsis.  All of the patient's questions were answered, patient is agreeable to proceed.  Consent signed and in chart.  Thank you for this interesting consult.  I greatly enjoyed meeting Jaclyn York and look forward to participating in their care.  A copy of this report was sent to the requesting provider on this date.  Electronically Signed: Joaquim Nam, PA-C 09/30/2021, 7:35 AM  I spent a total of  15 Minutes in face to face in clinical consultation, greater than 50% of which was counseling/coordinating care for drain exchange/upsize.

## 2021-09-30 NOTE — Procedures (Signed)
Pre procedural Dx: Diverticular abscess Post procedural Dx: Same  Drain injection is negative for definitive evidence of a significant enteric fistula. Given lack of out put from drain, resolution on previous CT and patient's overall poor tolerance of the drain, the decision was made to remove the drain which was done at the patient's bedside without incident.  EBL: None Complications: None immediate  PLAN: - Maintain follow-up appointments with GI and general surgery.  Ronny Bacon, MD Pager #: [phone]

## 2021-09-30 NOTE — Telephone Encounter (Signed)
Good afternoon Dr. Ardis Hughs,  This patient has requested a transfer of care to you.  She had been seen by [facility] in [place] in the past (records in [facility]) but wants to have all of her care in the [facility].  She was hospitalized recently for acute complicated diverticulitis and has requested to see you.  Please let me know if you approve the transfer.  Thank you.

## 2021-10-01 ENCOUNTER — Encounter: Payer: Self-pay | Admitting: Gastroenterology

## 2021-11-03 ENCOUNTER — Encounter: Payer: Self-pay | Admitting: Gastroenterology

## 2021-11-03 ENCOUNTER — Ambulatory Visit (INDEPENDENT_AMBULATORY_CARE_PROVIDER_SITE_OTHER): Payer: BC Managed Care – PPO | Admitting: Gastroenterology

## 2021-11-03 ENCOUNTER — Other Ambulatory Visit (INDEPENDENT_AMBULATORY_CARE_PROVIDER_SITE_OTHER): Payer: BC Managed Care – PPO

## 2021-11-03 VITALS — BP 112/68 | HR 64 | Ht 68.0 in | Wt 160.4 lb

## 2021-11-03 DIAGNOSIS — Z8719 Personal history of other diseases of the digestive system: Secondary | ICD-10-CM

## 2021-11-03 LAB — CBC
HCT: 42.5 % (ref 36.0–46.0)
Hemoglobin: 14 g/dL (ref 12.0–15.0)
MCHC: 33 g/dL (ref 30.0–36.0)
MCV: 91.9 fl (ref 78.0–100.0)
Platelets: 195 10*3/uL (ref 150.0–400.0)
RBC: 4.63 Mil/uL (ref 3.87–5.11)
RDW: 13.6 % (ref 11.5–15.5)
WBC: 5.9 10*3/uL (ref 4.0–10.5)

## 2021-11-03 LAB — COMPREHENSIVE METABOLIC PANEL
ALT: 16 U/L (ref 0–35)
AST: 16 U/L (ref 0–37)
Albumin: 4.5 g/dL (ref 3.5–5.2)
Alkaline Phosphatase: 42 U/L (ref 39–117)
BUN: 11 mg/dL (ref 6–23)
CO2: 29 mEq/L (ref 19–32)
Calcium: 9.6 mg/dL (ref 8.4–10.5)
Chloride: 102 mEq/L (ref 96–112)
Creatinine, Ser: 0.77 mg/dL (ref 0.40–1.20)
GFR: 81.89 mL/min (ref >60.00)
Glucose, Bld: 82 mg/dL (ref 70–99)
Potassium: 3.9 mEq/L (ref 3.5–5.1)
Sodium: 140 mEq/L (ref 135–145)
Total Bilirubin: 0.6 mg/dL (ref 0.2–1.2)
Total Protein: 7.5 g/dL (ref 6.0–8.3)

## 2021-11-03 NOTE — Patient Instructions (Signed)
If you are age 64 or younger, your body mass index should be between 19-25. Your Body mass index is 24.39 kg/m. If this is out of the aformentioned range listed, please consider follow up with your Primary Care Provider.  ________________________________________________________  The [place] [facility] providers would like to encourage you to use [facility] to communicate with providers for non-urgent requests or questions.  Due to long hold times on the telephone, sending your provider a message by [facility] may be a faster and more efficient way to get a response.  Please allow 48 business hours for a response.  Please remember that this is for non-urgent requests.  _______________________________________________________  Your provider has requested that you go to the basement level for lab work before leaving today. Press "B" on the elevator. The lab is located at the first door on the left as you exit the elevator.  You have been scheduled for a CT scan of the abdomen and pelvis at [facility] ([address]---this is in the same building as [employer]).   You are scheduled on 11-05-2021 at 9:30am. You should arrive 15 minutes prior to your appointment time for registration. Please follow the written instructions below on the day of your exam:  WARNING: IF YOU ARE ALLERGIC TO IODINE/X-RAY DYE, PLEASE NOTIFY RADIOLOGY IMMEDIATELY AT [phone]! YOU WILL BE GIVEN A 13 HOUR PREMEDICATION PREP.  1) Do not eat or drink anything after 5:30am (4 hours prior to your test) 2) You have been given 2 bottles of oral contrast to drink. The solution may taste better if refrigerated, but do NOT add ice or any other liquid to this solution. Shake well before drinking.    Drink 1 bottle of contrast @ 7:30am (2 hours prior to your exam)  Drink 1 bottle of contrast @ 8:30am (1 hour prior to your exam)  You may take any medications as prescribed with a small amount of water, if necessary. If you  take any of the following medications: METFORMIN, GLUCOPHAGE, GLUCOVANCE, AVANDAMET, RIOMET, FORTAMET, [place] MET, JANUMET, GLUMETZA or METAGLIP, you MAY be asked to HOLD this medication 48 hours AFTER the exam.  The purpose of you drinking the oral contrast is to aid in the visualization of your intestinal tract. The contrast solution may cause some diarrhea. Depending on your individual set of symptoms, you may also receive an intravenous injection of x-ray contrast/dye. Plan on being at [facility] for 30 minutes or longer, depending on the type of exam you are having performed.  This test typically takes 30-45 minutes to complete.  If you have any questions regarding your exam or if you need to reschedule, you may call the CT department at [phone] between the hours of 8:00 am and 5:00 pm, Monday-Friday. ________________________________________________________________________  Due to recent changes in healthcare laws, you may see the results of your imaging and laboratory studies on MyChart before your provider has had a chance to review them.  We understand that in some cases there may be results that are confusing or concerning to you. Not all laboratory results come back in the same time frame and the provider may be waiting for multiple results in order to interpret others.  Please give [place] 48 hours in order for your provider to thoroughly review all the results before contacting the office for clarification of your results.   Thank you for entrusting me with your care and choosing [facility].  Dr Ardis Hughs

## 2021-11-03 NOTE — Progress Notes (Signed)
HPI: This is a very pleasant 66 year old woman who was referred to me by Ryter-Brown, Shyrl Numbers, *  to evaluate severe sigmoid diverticulitis.    She was hospitalized about 2 months ago for complicated sigmoid diverticulitis.  It looks like she was in the hospital for 8 or 9 days.  She ended up needing transgluteal interventional radiology drain placement to a pelvic abscess.  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  They saw her 2 weeks later and the fistula was at that point not visible.  They removed the drain.  Since drain removal she has felt mainly better.  She is still bothered intermittently by left-sided abdominal pains and also some on the right side.  She has had no fevers or chills.  She has not followed up with surgery.  She has had multiple colonoscopies throughout her life through [facility] health in [place].  Her most recent one was in 2019 and she showed me some pictures from that.  That was for history of polyps.  2 4 mm polyps were removed from her colon and these were both tubular adenomas.  She was also noted to have diverticulosis.  She was not interested in following up with them any further and so she changed her care here    Review of systems: Pertinent positive and negative review of systems were noted in the above HPI section. All other review negative.   Past Medical History:  Diagnosis Date   Diverticulosis     Past Surgical History:  Procedure Laterality Date   IR CATHETER TUBE CHANGE  09/30/2021   IR RADIOLOGIST EVAL & MGMT  09/17/2021   IR RADIOLOGIST EVAL & MGMT  09/29/2021    Current Outpatient Medications  Medication Sig Dispense Refill   estradiol (VIVELLE-DOT) 0.025 MG/24HR Place 1 patch onto the skin 2 (two) times a week. Monday and Thursday     feeding supplement (ENSURE ENLIVE / ENSURE PLUS)  LIQD Take 237 mLs by mouth 2 (two) times daily between meals. (Patient taking differently: Take 237 mLs by mouth 2 (two) times daily between meals. As needed) 14220 mL 1   levocetirizine (XYZAL) 5 MG tablet Take 5 mg by mouth every evening.     ondansetron (ZOFRAN) 4 MG tablet Take 1 tablet (4 mg total) by mouth every 6 (six) hours as needed for nausea. 20 tablet 0   progesterone (PROMETRIUM) 100 MG capsule Take 100 mg by mouth at bedtime.     senna-docusate (SENOKOT-S) 8.6-50 MG tablet Take 1 tablet by mouth 2 (two) times daily as needed for mild constipation. 60 tablet 0   simethicone (MYLICON) 80 MG chewable tablet Chew 1 tablet (80 mg total) by mouth every 6 (six) hours as needed for flatulence. 30 tablet 0   No current facility-administered medications for this visit.    Allergies as of 11/03/2021   (No Known Allergies)    Family History  Problem Relation Age of Onset   Diverticulosis Mother    Colon polyps Mother    Lung cancer Father    Esophageal cancer Father    Prostate cancer Father    Cancer Brother        laryngeal   Breast cancer Cousin    Crohn's disease Cousin        x 2   Breast cancer Paternal 71  Diabetes Paternal Aunt    Colon cancer Neg Hx     Social History   Socioeconomic History   Marital status: Married    Spouse name: Not on file   Number of children: Not on file   Years of education: Not on file   Highest education level: Not on file  Occupational History   Not on file  Tobacco Use   Smoking status: Former    Types: Cigarettes   Smokeless tobacco: Never  Vaping Use   Vaping Use: Never used  Substance and Sexual Activity   Alcohol use: Yes    Comment: SOCIALLY   Drug use: Never   Sexual activity: Yes  Other Topics Concern   Not on file  Social History Narrative   Not on file   Social Determinants of Health   Financial Resource Strain: Not on file  Food Insecurity: Not on file  Transportation Needs: Not on file  Physical  Activity: Not on file  Stress: Not on file  Social Connections: Not on file  Intimate Partner Violence: Not on file     Physical Exam: BP 112/68    Pulse 64    Ht 5\' 8"  (1.727 m)    Wt 160 lb 6.4 oz (72.8 kg)    BMI 24.39 kg/m  Constitutional: generally well-appearing Psychiatric: alert and oriented x3 Eyes: extraocular movements intact Mouth: oral pharynx moist, no lesions Neck: supple no lymphadenopathy Cardiovascular: heart regular rate and rhythm Lungs: clear to auscultation bilaterally Abdomen: soft, mild left lower quadrant abdominal tenderness, nondistended, no obvious ascites, no peritoneal signs, normal bowel sounds Extremities: no lower extremity edema bilaterally Skin: no lesions on visible extremities   Assessment and plan: 66 y.o. female with recent severe sigmoid diverticulitis with abscess requiring drain placement  She seems to have recovered for the most part but she is still bothered by intermittent left-sided and sometimes right-sided abdominal pains.  She has not had repeat imaging since her pelvic abscess drain was removed and understands that we need to do that for her now CT scan abdomen pelvis, IV and oral contrast.  She will also get a repeat set of labs including CBC and complete metabolic profile.  Pending those results we will arrange for a repeat colonoscopy her last was about 4 years ago in [place] and she is not interested in following up with her previous GI team.  Pending all of that she understands that she would probably benefit from elective sigmoid resection given her severe acute diverticulitis and we will work to get her back into see [facility] surgery team here hopefully shortly after her colonoscopy.  Please see the "Patient Instructions" section for addition details about the plan.   Owens Loffler, MD [facility] 11/03/2021, 10:49 AM  Cc: Wayland Salinas, *  Total time on date of encounter was 45  minutes  (this included time spent preparing to see the patient reviewing records; obtaining and/or reviewing separately obtained history; performing a medically appropriate exam and/or evaluation; counseling and educating the patient and family if present; ordering medications, tests or procedures if applicable; and documenting clinical information in the health record).

## 2021-11-05 ENCOUNTER — Inpatient Hospital Stay: Admission: RE | Admit: 2021-11-05 | Payer: BC Managed Care – PPO | Source: Ambulatory Visit

## 2021-11-11 ENCOUNTER — Telehealth: Payer: Self-pay | Admitting: Gastroenterology

## 2021-11-11 NOTE — Telephone Encounter (Signed)
Inbound call from patient requesting a call please.  States she started to rectal bleeding since this morning.  Please advise.

## 2021-11-11 NOTE — Telephone Encounter (Signed)
Returned pt call and she tells me that she is on the phone with CT trying to move her appt up form 1/30.  Last seen on 1/17 by Dr Ardis Hughs.  See note below:    "She was hospitalized about 2 months ago for complicated sigmoid diverticulitis.  It looks like she was in the hospital for 8 or 9 days.  She ended up needing transgluteal interventional radiology drain placement to a pelvic abscess.  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  They saw her 2 weeks later and the fistula was at that point not visible. They removed the drain."    She did quickly tell me that she has some "slight" BRBPR today and is concerned.  She will go to the ED if the bleeding is heavy and will have CT hopefully this Friday. She will call back if she has any further concerns.

## 2021-11-13 ENCOUNTER — Encounter: Payer: Self-pay | Admitting: Gastroenterology

## 2021-11-13 ENCOUNTER — Other Ambulatory Visit: Payer: Self-pay

## 2021-11-13 ENCOUNTER — Ambulatory Visit (INDEPENDENT_AMBULATORY_CARE_PROVIDER_SITE_OTHER)
Admission: RE | Admit: 2021-11-13 | Discharge: 2021-11-13 | Disposition: A | Discharge status: Home / Self Care | Payer: BC Managed Care – PPO | Source: Ambulatory Visit | Attending: Gastroenterology | Admitting: Gastroenterology

## 2021-11-13 DIAGNOSIS — Z8719 Personal history of other diseases of the digestive system: Secondary | ICD-10-CM | POA: Diagnosis not present

## 2021-11-13 MED ORDER — IOHEXOL 300 MG/ML  SOLN
100.0000 mL | Freq: Once | INTRAMUSCULAR | Status: AC | PRN
  Administered 2021-11-13: 100 mL via INTRAVENOUS

## 2021-11-16 ENCOUNTER — Other Ambulatory Visit: Payer: BC Managed Care – PPO

## 2021-11-20 ENCOUNTER — Other Ambulatory Visit: Payer: Self-pay

## 2021-11-20 DIAGNOSIS — Z8719 Personal history of other diseases of the digestive system: Secondary | ICD-10-CM

## 2021-11-20 MED ORDER — PEG 3350-KCL-NA BICARB-NACL 420 G PO SOLR: 4000.0000 mL | Freq: Once | ORAL | 0 refills | Status: AC

## 2021-11-20 NOTE — Telephone Encounter (Signed)
Patient has procedure scheduled for 12/11/21 at 3:30 p.m.  Thank you.

## 2021-12-04 ENCOUNTER — Encounter: Payer: Self-pay | Admitting: Gastroenterology

## 2021-12-11 ENCOUNTER — Ambulatory Visit (AMBULATORY_SURGERY_CENTER): Payer: BC Managed Care – PPO | Admitting: Gastroenterology

## 2021-12-11 ENCOUNTER — Encounter: Payer: Self-pay | Admitting: Gastroenterology

## 2021-12-11 VITALS — BP 120/72 | HR 62 | Resp 11

## 2021-12-11 DIAGNOSIS — K5792 Diverticulitis of intestine, part unspecified, without perforation or abscess without bleeding: Secondary | ICD-10-CM

## 2021-12-11 DIAGNOSIS — D120 Benign neoplasm of cecum: Secondary | ICD-10-CM | POA: Diagnosis not present

## 2021-12-11 DIAGNOSIS — D122 Benign neoplasm of ascending colon: Secondary | ICD-10-CM | POA: Diagnosis not present

## 2021-12-11 MED ORDER — SODIUM CHLORIDE 0.9 % IV SOLN: 500.0000 mL | INTRAVENOUS | Status: DC

## 2021-12-11 NOTE — Progress Notes (Signed)
HPI: This is a woman with complicated diverticular disease, 2022 severe diverticulitis.Last colonoscopy 2019 [place], two subCM adenomas removed.   ROS: complete GI ROS as described in HPI, all other review negative.  Constitutional:  No unintentional weight loss   Past Medical History:  Diagnosis Date   Diverticulosis     Past Surgical History:  Procedure Laterality Date   IR CATHETER TUBE CHANGE  09/30/2021   IR RADIOLOGIST EVAL & MGMT  09/17/2021   IR RADIOLOGIST EVAL & MGMT  09/29/2021    Current Outpatient Medications  Medication Sig Dispense Refill   estradiol (VIVELLE-DOT) 0.025 MG/24HR 1 patch twice a week     fluticasone (FLONASE) 50 MCG/ACT nasal spray SHAKE LIQUID AND USE 2 SPRAYS IN EACH NOSTRIL DAILY     levocetirizine (XYZAL) 5 MG tablet Take 5 mg by mouth every evening.     progesterone (PROMETRIUM) 100 MG capsule Take 100 mg by mouth at bedtime.     estradiol (VIVELLE-DOT) 0.025 MG/24HR Place 1 patch onto the skin 2 (two) times a week. Monday and Thursday     loratadine (CLARITIN) 10 MG tablet Take by mouth. (Patient not taking: Reported on 12/11/2021)     ondansetron (ZOFRAN) 4 MG tablet Take 1 tablet (4 mg total) by mouth every 6 (six) hours as needed for nausea. 20 tablet 0   senna-docusate (SENOKOT-S) 8.6-50 MG tablet Take 1 tablet by mouth 2 (two) times daily as needed for mild constipation. 60 tablet 0   simethicone (MYLICON) 80 MG chewable tablet Chew 1 tablet (80 mg total) by mouth every 6 (six) hours as needed for flatulence. 30 tablet 0   Current Facility-Administered Medications  Medication Dose Route Frequency Provider Last Rate Last Admin   0.9 %  sodium chloride infusion  500 mL Intravenous Continuous Milus Banister, MD        Allergies as of 12/11/2021   (No Known Allergies)    Family History  Problem Relation Age of Onset   Diverticulosis Mother    Colon polyps Mother    Lung cancer Father    Esophageal cancer Father    Prostate  cancer Father    Cancer Brother        laryngeal   Breast cancer Cousin    Crohn's disease Cousin        x 2   Breast cancer Paternal Aunt    Diabetes Paternal Aunt    Colon cancer Neg Hx     Social History   Socioeconomic History   Marital status: Married    Spouse name: Not on file   Number of children: Not on file   Years of education: Not on file   Highest education level: Not on file  Occupational History   Not on file  Tobacco Use   Smoking status: Former    Types: Cigarettes   Smokeless tobacco: Never  Vaping Use   Vaping Use: Never used  Substance and Sexual Activity   Alcohol use: Yes    Comment: SOCIALLY   Drug use: Never   Sexual activity: Yes  Other Topics Concern   Not on file  Social History Narrative   Not on file   Social Determinants of Health   Financial Resource Strain: Not on file  Food Insecurity: Not on file  Transportation Needs: Not on file  Physical Activity: Not on file  Stress: Not on file  Social Connections: Not on file  Intimate Partner Violence: Not on file  Physical Exam: BP (!) 141/76    Pulse 74    Resp 20    SpO2 97%  Constitutional: generally well-appearing Psychiatric: alert and oriented x3 Lungs: CTA bilaterally Heart: no MCR  Assessment and plan: 66 y.o. female with h/o polyps, cimplicated diverticulitis   For colonoscoy today  Care is appropriate for the ambulatory setting.  Owens Loffler, MD [facility] 12/11/2021, 10:20 AM

## 2021-12-11 NOTE — Op Note (Signed)
[facility] Patient Name: Jaclyn York Procedure Date: 12/11/2021 9:47 AM MRN: [id number] Endoscopist: Milus Banister , MD Age: 66 Referring MD:  Date of Birth: Feb 15, 1956 Gender: Female Account #: [id number] Procedure:                Colonoscopy Indications:              High risk colon cancer surveillance: Personal                            history of colonic polyps; complicated diverticular                            disease, 2022 severe diverticulitis. Last                            colonoscopy 2019 [place], two subCM adenomas                            removed. Medicines:                Monitored Anesthesia Care Procedure:                Pre-Anesthesia Assessment:                           - Prior to the procedure, a History and Physical                            was performed, and patient medications and                            allergies were reviewed. The patient's tolerance of                            previous anesthesia was also reviewed. The risks                            and benefits of the procedure and the sedation                            options and risks were discussed with the patient.                            All questions were answered, and informed consent                            was obtained. Prior Anticoagulants: The patient has                            taken no previous anticoagulant or antiplatelet                            agents. ASA Grade Assessment: II - A patient with  mild systemic disease. After reviewing the risks                            and benefits, the patient was deemed in                            satisfactory condition to undergo the procedure.                           After obtaining informed consent, the colonoscope                            was passed under direct vision. Throughout the                            procedure, the patient's blood pressure, pulse, and                             oxygen saturations were monitored continuously. The                            CF [id number] #[id number] was introduced through the anus                            and advanced to the the cecum, identified by                            appendiceal orifice and ileocecal valve. The                            colonoscopy was performed without difficulty. The                            patient tolerated the procedure well. The quality                            of the bowel preparation was good. The ileocecal                            valve, appendiceal orifice, and rectum were                            photographed. Scope In: 10:05:06 AM Scope Out: 10:19:07 AM Scope Withdrawal Time: 0 hours 7 minutes 5 seconds  Total Procedure Duration: 0 hours 14 minutes 1 second  Findings:                 Two sessile polyps were found in the ascending                            colon and cecum. The polyps were 2 to 3 mm in size.                            These polyps were removed with a cold  snare.                            Resection and retrieval were complete.                           Multiple small and large-mouthed diverticula were                            found in the left colon, this was associated with                            mucosal edema, luminal tortuosity and narrowing.                           Apparent previous sigmoid colon submucosal tattoo                            noted. Otherwise normal mucosa at the site.                           External and internal hemorrhoids were found. The                            hemorrhoids were small.                           The exam was otherwise without abnormality on                            direct and retroflexion views. Complications:            No immediate complications. Estimated blood loss:                            None. Estimated Blood Loss:     Estimated blood loss: none. Impression:               - Two 2 to 3 mm polyps  in the ascending colon and                            in the cecum, removed with a cold snare. Resected                            and retrieved.                           - Multiple small and large-mouthed diverticula were                            found in the left colon, this was associated with                            mucosal edema, luminal tortuosity and narrowing.                           -  Apparent previous sigmoid colon submucosal tattoo                            noted. Otherwise normal mucosa at the site. This                            may have been done during previous colonoscopy                            (records all elsewhere).                           - External and internal hemorrhoids.                           - The examination was otherwise normal on direct                            and retroflexion views. Recommendation:           - Patient has a contact number available for                            emergencies. The signs and symptoms of potential                            delayed complications were discussed with the                            patient. Return to normal activities tomorrow.                            Written discharge instructions were provided to the                            patient.                           - Resume previous diet.                           - Continue present medications.                           - Await pathology results. Milus Banister, MD 12/11/2021 10:27:31 AM This report has been signed electronically.

## 2021-12-11 NOTE — Progress Notes (Signed)
Pt's states no medical or surgical changes since previsit or office visit. 

## 2021-12-11 NOTE — Progress Notes (Signed)
Report given to PACU, vss 

## 2021-12-11 NOTE — Patient Instructions (Signed)
Handouts provided on polyps, diverticulosis and hemorrhoids.   YOU HAD AN ENDOSCOPIC PROCEDURE TODAY AT THE [facility]:   Refer to the procedure report that was given to you for any specific questions about what was found during the examination.  If the procedure report does not answer your questions, please call your gastroenterologist to clarify.  If you requested that your care partner not be given the details of your procedure findings, then the procedure report has been included in a sealed envelope for you to review at your convenience later.  YOU SHOULD EXPECT: Some feelings of bloating in the abdomen. Passage of more gas than usual.  Walking can help get rid of the air that was put into your GI tract during the procedure and reduce the bloating. If you had a lower endoscopy (such as a colonoscopy or flexible sigmoidoscopy) you may notice spotting of blood in your stool or on the toilet paper. If you underwent a bowel prep for your procedure, you may not have a normal bowel movement for a few days.  Please Note:  You might notice some irritation and congestion in your nose or some drainage.  This is from the oxygen used during your procedure.  There is no need for concern and it should clear up in a day or so.  SYMPTOMS TO REPORT IMMEDIATELY:   Following lower endoscopy (colonoscopy or flexible sigmoidoscopy):  Excessive amounts of blood in the stool  Significant tenderness or worsening of abdominal pains  Swelling of the abdomen that is new, acute  Fever of 100F or higher   For urgent or emergent issues, a gastroenterologist can be reached at any hour by calling [phone]. Do not use MyChart messaging for urgent concerns.    DIET:  We do recommend a small meal at first, but then you may proceed to your regular diet.  Drink plenty of fluids but you should avoid alcoholic beverages for 24 hours.  ACTIVITY:  You should plan to take it easy for the rest of today and  you should NOT DRIVE or use heavy machinery until tomorrow (because of the sedation medicines used during the test).    FOLLOW UP: Our staff will call the number listed on your records 48-72 hours following your procedure to check on you and address any questions or concerns that you may have regarding the information given to you following your procedure. If we do not reach you, we will leave a message.  We will attempt to reach you two times.  During this call, we will ask if you have developed any symptoms of COVID 19. If you develop any symptoms (ie: fever, flu-like symptoms, shortness of breath, cough etc.) before then, please call [phone].  If you test positive for Covid 19 in the 2 weeks post procedure, please call and report this information to us.    If any biopsies were taken you will be contacted by phone or by letter within the next 1-3 weeks.  Please call us at [phone] if you have not heard about the biopsies in 3 weeks.    SIGNATURES/CONFIDENTIALITY: You and/or your care partner have signed paperwork which will be entered into your electronic medical record.  These signatures attest to the fact that that the information above on your After Visit Summary has been reviewed and is understood.  Full responsibility of the confidentiality of this discharge information lies with you and/or your care-partner.  

## 2021-12-11 NOTE — Progress Notes (Signed)
Called to room to assist during endoscopic procedure.  Patient ID and intended procedure confirmed with present staff. Received instructions for my participation in the procedure from the performing physician.  

## 2021-12-15 ENCOUNTER — Telehealth: Payer: Self-pay | Admitting: *Deleted

## 2021-12-15 ENCOUNTER — Telehealth: Payer: Self-pay

## 2021-12-15 NOTE — Telephone Encounter (Signed)
°  Follow up Call-  Call back number 12/11/2021  Post procedure Call Back phone  # [phone]  Permission to leave phone message Yes     Patient questions:  Do you have a fever, pain , or abdominal swelling? No. Pain Score  0 *  Have you tolerated food without any problems? Yes.    Have you been able to return to your normal activities? Yes.    Do you have any questions about your discharge instructions: Diet   No. Medications  No. Follow up visit  No.  Do you have questions or concerns about your Care? No.  Actions: * If pain score is 4 or above: No action needed, pain <4.

## 2021-12-15 NOTE — Telephone Encounter (Signed)
First attempt, left VM.  

## 2021-12-16 ENCOUNTER — Encounter: Payer: Self-pay | Admitting: Gastroenterology

## 2021-12-18 NOTE — Telephone Encounter (Signed)
Opened in error

## 2021-12-25 ENCOUNTER — Other Ambulatory Visit: Payer: Self-pay | Admitting: Urology

## 2022-01-12 ENCOUNTER — Other Ambulatory Visit (HOSPITAL_COMMUNITY): Payer: Self-pay

## 2022-01-12 NOTE — Progress Notes (Signed)
Pt. Needs orders for upcoming surgery. ?

## 2022-01-12 NOTE — Patient Instructions (Addendum)
DUE TO COVID-19 ONLY ONE VISITOR  (aged 64 and older)  IS ALLOWED TO COME WITH YOU AND STAY IN THE WAITING ROOM ONLY DURING PRE OP AND PROCEDURE.   ?**NO VISITORS ARE ALLOWED IN THE SHORT STAY AREA OR RECOVERY ROOM!!** ? ?IF YOU WILL BE ADMITTED INTO THE HOSPITAL YOU ARE ALLOWED ONLY TWO SUPPORT PEOPLE DURING VISITATION HOURS ONLY (7 AM -8PM)   ?The support person(s) must pass our screening, gel in and out, and wear a mask at all times, including in the patient?s room. ?Patients must also wear a mask when staff or their support person are in the room. ?Visitors GUEST BADGE MUST BE WORN VISIBLY  ?One adult visitor may remain with you overnight and MUST be in the room by 8 P.M. ?  ? ? Your procedure is scheduled on: 02/04/22 ? ? Report to [facility] Main Entrance ? ?  Report to Short stay at: 5:15 AM ? ? Call this number if you have problems the morning of surgery [phone] ? ? CLEAR LIQUIDS STARTING THE DAY BEFORE SURGERY until: 4:30 AM DAY OF SURGERY. MAKE SURE YOU DRINK PLENTY LIQUIDS. ? ?Water ?Black Coffee (sugar ok, NO MILK/CREAM OR CREAMERS)  ?Tea (sugar ok, NO MILK/CREAM OR CREAMERS) regular and decaf                             ?Plain Jell-O (NO RED)                                           ?Fruit ices (not with fruit pulp, NO RED)                                     ?Popsicles (NO RED)                                                                  ?Juice: apple, WHITE grape, WHITE cranberry ?Sports drinks like Gatorade (NO RED) ?Clear broth(vegetable,chicken,beef) ? ?FOLLOW BOWEL PREP AND ANY ADDITIONAL PRE OP INSTRUCTIONS YOU RECEIVED FROM YOUR SURGEON'S OFFICE!!! ?  ?Oral Hygiene is also important to reduce your risk of infection.                                    ?Remember - BRUSH YOUR TEETH THE MORNING OF SURGERY WITH YOUR REGULAR TOOTHPASTE ? ? Do NOT smoke after Midnight ? ? Take these medicines the morning of surgery with A SIP OF WATER: Zofran as needed.Flonase as usual. ? ?DO NOT  TAKE ANY ORAL DIABETIC MEDICATIONS DAY OF YOUR SURGERY ? ?Bring CPAP mask and tubing day of surgery. ?                  ?           You may not have any metal on your body including hair pins, jewelry, and body piercing ? ?  Do not wear make-up, lotions, powders, perfumes/cologne, or deodorant ? ?Do not wear nail polish including gel and S&S, artificial/acrylic nails, or any other type of covering on natural nails including finger and toenails. If you have artificial nails, gel coating, etc. that needs to be removed by a nail salon please have this removed prior to surgery or surgery may need to be canceled/ delayed if the surgeon/ anesthesia feels like they are unable to be safely monitored.  ? ?Do not shave  48 hours prior to surgery.  ? ? Do not bring valuables to the hospital. [facility] NOT ?            RESPONSIBLE   FOR VALUABLES. ? ? Contacts, dentures or bridgework may not be worn into surgery. ? ? Bring small overnight bag day of surgery. ?  ? Patients discharged on the day of surgery will not be allowed to drive home.  Someone NEEDS to stay with you for the first 24 hours after anesthesia. ? ? Special Instructions: Bring a copy of your healthcare power of attorney and living will documents         the day of surgery if you haven't scanned them before. ? ?            Please read over the following fact sheets you were given: [place] [phone] ? ?   [employer] - Preparing for Surgery ?Before surgery, you can play an important role.  Because skin is not sterile, your skin needs to be as free of germs as possible.  You can reduce the number of germs on your skin by washing with CHG (chlorahexidine gluconate) soap before surgery.  CHG is an antiseptic cleaner which kills germs and bonds with the skin to continue killing germs even after washing. ?Please DO NOT use if you have an allergy to CHG or antibacterial soaps.  If your skin becomes  reddened/irritated stop using the CHG and inform your nurse when you arrive at Short Stay. ?Do not shave (including legs and underarms) for at least 48 hours prior to the first CHG shower.  You may shave your face/neck. ?Please follow these instructions carefully: ? 1.  Shower with CHG Soap the night before surgery and the  morning of Surgery. ? 2.  If you choose to wash your hair, wash your hair first as usual with your  normal  shampoo. ? 3.  After you shampoo, rinse your hair and body thoroughly to remove the  shampoo.                           4.  Use CHG as you would any other liquid soap.  You can apply chg directly  to the skin and wash  ?                     Gently with a scrungie or clean washcloth. ? 5.  Apply the CHG Soap to your body ONLY FROM THE NECK DOWN.   Do not use on face/ open      ?                     Wound or open sores. Avoid contact with eyes, ears mouth and genitals (private parts).  ?  Wash face,  Genitals (private parts) with your normal soap. ?            6.  Wash thoroughly, paying special attention to the area where your surgery  will be performed. ? 7.  Thoroughly rinse your body with warm water from the neck down. ? 8.  DO NOT shower/wash with your normal soap after using and rinsing off  the CHG Soap. ?               9.  Pat yourself dry with a clean towel. ?           10.  Wear clean pajamas. ?           11.  Place clean sheets on your bed the night of your first shower and do not  sleep with pets. ?Day of Surgery : ?Do not apply any lotions/deodorants the morning of surgery.  Please wear clean clothes to the hospital/surgery center. ? ?FAILURE TO FOLLOW THESE INSTRUCTIONS MAY RESULT IN THE CANCELLATION OF YOUR SURGERY ?PATIENT SIGNATURE_________________________________ ? ?NURSE SIGNATURE__________________________________ ? ?________________________________________________________________________  ?

## 2022-01-13 ENCOUNTER — Encounter (HOSPITAL_COMMUNITY)
Admission: RE | Admit: 2022-01-13 | Discharge: 2022-01-13 | Disposition: A | Discharge status: Home / Self Care | Payer: BC Managed Care – PPO | Source: Ambulatory Visit | Attending: Surgery | Admitting: Surgery

## 2022-01-13 ENCOUNTER — Other Ambulatory Visit: Payer: Self-pay

## 2022-01-13 ENCOUNTER — Encounter (HOSPITAL_COMMUNITY): Payer: Self-pay

## 2022-01-13 VITALS — BP 129/77 | HR 64 | Temp 98.0°F | Resp 16 | Ht 68.0 in | Wt 166.0 lb

## 2022-01-13 DIAGNOSIS — Z01812 Encounter for preprocedural laboratory examination: Secondary | ICD-10-CM | POA: Diagnosis present

## 2022-01-13 DIAGNOSIS — Z01818 Encounter for other preprocedural examination: Secondary | ICD-10-CM

## 2022-01-13 LAB — CBC
HCT: 45.3 % (ref 36.0–46.0)
Hemoglobin: 15.3 g/dL — ABNORMAL HIGH (ref 12.0–15.0)
MCH: 31.2 pg (ref 26.0–34.0)
MCHC: 33.8 g/dL (ref 30.0–36.0)
MCV: 92.3 fL (ref 80.0–100.0)
Platelets: 195 10*3/uL (ref 150–400)
RBC: 4.91 MIL/uL (ref 3.87–5.11)
RDW: 13 % (ref 11.5–15.5)
WBC: 5.5 10*3/uL (ref 4.0–10.5)
nRBC: 0 % (ref 0.0–0.2)

## 2022-01-13 NOTE — Progress Notes (Addendum)
For Short Stay: ?[place] appointment date: N/A ?Date of COVID positive in last 90 days: N/A ?COVID Vaccine: Pfizer x 3: 09/22/20 ?Bowel Prep reminder: Reviewed. ? ? ?For Anesthesia: ?PCP - Dr. Elenor Quinones ?Cardiologist -  ? ?Chest x-ray -  ?EKG -  ?Stress Test -  ?ECHO -  ?Cardiac Cath -  ?Pacemaker/ICD device last checked: ?Pacemaker orders received: ?Device Rep notified: ? ?Spinal Cord Stimulator: ? ?Sleep Study -  ?CPAP -  ? ?Fasting Blood Sugar -  ?Checks Blood Sugar _____ times a day ?Date and result of last Hgb A1c- ? ?Blood Thinner Instructions: ?Aspirin Instructions: ?Last Dose: ? ?Activity level: Can go up a flight of stairs and activities of daily living without stopping and without chest pain and/or shortness of breath ?  Able to exercise without chest pain and/or shortness of breath ?  Unable to go up a flight of stairs without chest pain and/or shortness of breath ?   ? ?Anesthesia review:  ? ?Patient denies shortness of breath, fever, cough and chest pain at PAT appointment ? ? ?Patient verbalized understanding of instructions that were given to them at the PAT appointment. Patient was also instructed that they will need to review over the PAT instructions again at home before surgery.  ?

## 2022-02-03 ENCOUNTER — Ambulatory Visit: Payer: Self-pay | Admitting: Surgery

## 2022-02-03 DIAGNOSIS — Z01818 Encounter for other preprocedural examination: Secondary | ICD-10-CM

## 2022-02-03 NOTE — Anesthesia Preprocedure Evaluation (Addendum)
Anesthesia Evaluation  ?Patient identified by MRN, date of birth, ID band ?Patient awake ? ? ? ?Reviewed: ?Allergy & Precautions, NPO status , Patient's Chart, lab work & pertinent test results ? ?History of Anesthesia Complications ?Negative for: history of anesthetic complications ? ?Airway ?Mallampati: II ? ?TM Distance: >3 FB ?Neck ROM: Limited ? ? ? Dental ?no notable dental hx. ?(+) Dental Advisory Given ?  ?Pulmonary ?neg pulmonary ROS, former smoker,  ?  ?Pulmonary exam normal ? ? ? ? ? ? ? Cardiovascular ?negative cardio ROS ?Normal cardiovascular exam ? ? ?  ?Neuro/Psych ?negative neurological ROS ?   ? GI/Hepatic ?Neg liver ROS, GERD  ,diverticulitis ?  ?Endo/Other  ?negative endocrine ROS ? Renal/GU ?negative Renal ROS  ? ?  ?Musculoskeletal ?negative musculoskeletal ROS ?(+)  ? Abdominal ?  ?Peds ? Hematology ?negative hematology ROS ?(+)   ?Anesthesia Other Findings ? ? Reproductive/Obstetrics ? ?  ? ? ? ? ? ? ? ? ? ? ? ? ? ?  ?  ? ? ? ? ? ? ? ?Anesthesia Physical ?Anesthesia Plan ? ?ASA: 2 ? ?Anesthesia Plan: General  ? ?Post-op Pain Management: Celebrex PO (pre-op)* and Tylenol PO (pre-op)*  ? ?Induction: Intravenous ? ?PONV Risk Score and Plan: 4 or greater and Ondansetron, Dexamethasone, Midazolam and Scopolamine patch - Pre-op ? ?Airway Management Planned: Oral ETT ? ?Additional Equipment:  ? ?Intra-op Plan:  ? ?Post-operative Plan: Extubation in OR ? ?Informed Consent: I have reviewed the patients History and Physical, chart, labs and discussed the procedure including the risks, benefits and alternatives for the proposed anesthesia with the patient or authorized representative who has indicated his/her understanding and acceptance.  ? ? ? ?Dental advisory given ? ?Plan Discussed with: Anesthesiologist and CRNA ? ?Anesthesia Plan Comments:   ? ? ? ? ? ?Anesthesia Quick Evaluation ? ?

## 2022-02-04 ENCOUNTER — Encounter (HOSPITAL_COMMUNITY): Payer: Self-pay | Admitting: Surgery

## 2022-02-04 ENCOUNTER — Inpatient Hospital Stay (HOSPITAL_COMMUNITY): Payer: BC Managed Care – PPO | Admitting: Anesthesiology

## 2022-02-04 ENCOUNTER — Encounter (HOSPITAL_COMMUNITY)
Admission: RE | Disposition: A | Discharge status: Home / Self Care | Payer: Self-pay | Source: Home / Self Care | Attending: Surgery

## 2022-02-04 ENCOUNTER — Other Ambulatory Visit: Payer: Self-pay

## 2022-02-04 ENCOUNTER — Inpatient Hospital Stay (HOSPITAL_COMMUNITY)
Admission: RE | Admit: 2022-02-04 | Discharge: 2022-02-06 | DRG: 331 | Disposition: A | Discharge status: Home / Self Care | Payer: BC Managed Care – PPO | Attending: Surgery | Admitting: Surgery

## 2022-02-04 DIAGNOSIS — Z800 Family history of malignant neoplasm of digestive organs: Secondary | ICD-10-CM

## 2022-02-04 DIAGNOSIS — Z833 Family history of diabetes mellitus: Secondary | ICD-10-CM | POA: Diagnosis not present

## 2022-02-04 DIAGNOSIS — Z8371 Family history of colonic polyps: Secondary | ICD-10-CM | POA: Diagnosis not present

## 2022-02-04 DIAGNOSIS — Z803 Family history of malignant neoplasm of breast: Secondary | ICD-10-CM | POA: Diagnosis not present

## 2022-02-04 DIAGNOSIS — Z801 Family history of malignant neoplasm of trachea, bronchus and lung: Secondary | ICD-10-CM

## 2022-02-04 DIAGNOSIS — Z87891 Personal history of nicotine dependence: Secondary | ICD-10-CM

## 2022-02-04 DIAGNOSIS — Z01818 Encounter for other preprocedural examination: Secondary | ICD-10-CM

## 2022-02-04 DIAGNOSIS — K5730 Diverticulosis of large intestine without perforation or abscess without bleeding: Principal | ICD-10-CM | POA: Diagnosis present

## 2022-02-04 DIAGNOSIS — J309 Allergic rhinitis, unspecified: Secondary | ICD-10-CM | POA: Diagnosis present

## 2022-02-04 DIAGNOSIS — K589 Irritable bowel syndrome without diarrhea: Secondary | ICD-10-CM | POA: Diagnosis present

## 2022-02-04 DIAGNOSIS — Z8042 Family history of malignant neoplasm of prostate: Secondary | ICD-10-CM

## 2022-02-04 DIAGNOSIS — L309 Dermatitis, unspecified: Secondary | ICD-10-CM | POA: Diagnosis present

## 2022-02-04 DIAGNOSIS — K660 Peritoneal adhesions (postprocedural) (postinfection): Secondary | ICD-10-CM | POA: Diagnosis present

## 2022-02-04 DIAGNOSIS — Z9049 Acquired absence of other specified parts of digestive tract: Secondary | ICD-10-CM

## 2022-02-04 DIAGNOSIS — K5732 Diverticulitis of large intestine without perforation or abscess without bleeding: Principal | ICD-10-CM | POA: Diagnosis present

## 2022-02-04 DIAGNOSIS — Z79899 Other long term (current) drug therapy: Secondary | ICD-10-CM | POA: Diagnosis not present

## 2022-02-04 HISTORY — PX: XI ROBOTIC ASSISTED LOWER ANTERIOR RESECTION: SHX6558

## 2022-02-04 HISTORY — PX: FLEXIBLE SIGMOIDOSCOPY: SHX5431

## 2022-02-04 LAB — TYPE AND SCREEN
ABO/RH(D): A POS
Antibody Screen: NEGATIVE

## 2022-02-04 LAB — ABO/RH: ABO/RH(D): A POS

## 2022-02-04 SURGERY — RESECTION, RECTUM, LOW ANTERIOR, ROBOT-ASSISTED
Anesthesia: General

## 2022-02-04 MED ORDER — METRONIDAZOLE 500 MG PO TABS: 1000.0000 mg | ORAL_TABLET | ORAL | Status: DC

## 2022-02-04 MED ORDER — DEXAMETHASONE SODIUM PHOSPHATE 10 MG/ML IJ SOLN
INTRAMUSCULAR | Status: DC | PRN
Start: 2022-02-04 — End: 2022-02-04
  Administered 2022-02-04: 10 mg via INTRAVENOUS

## 2022-02-04 MED ORDER — HYDRALAZINE HCL 20 MG/ML IJ SOLN: 10.0000 mg | INTRAMUSCULAR | Status: DC | PRN

## 2022-02-04 MED ORDER — CELECOXIB 200 MG PO CAPS
200.0000 mg | ORAL_CAPSULE | Freq: Once | ORAL | Status: AC
  Administered 2022-02-04: 200 mg via ORAL
  Filled 2022-02-04: qty 1

## 2022-02-04 MED ORDER — MIDAZOLAM HCL 2 MG/2ML IJ SOLN
INTRAMUSCULAR | Status: DC | PRN
  Administered 2022-02-04: 2 mg via INTRAVENOUS

## 2022-02-04 MED ORDER — PROPOFOL 10 MG/ML IV BOLUS
INTRAVENOUS | Status: AC
Start: 2022-02-04 — End: ?
  Filled 2022-02-04: qty 20

## 2022-02-04 MED ORDER — BUPIVACAINE LIPOSOME 1.3 % IJ SUSP
INTRAMUSCULAR | Status: DC | PRN
  Administered 2022-02-04: 20 mL

## 2022-02-04 MED ORDER — DIPHENHYDRAMINE HCL 50 MG/ML IJ SOLN: 12.5000 mg | Freq: Four times a day (QID) | INTRAMUSCULAR | Status: DC | PRN

## 2022-02-04 MED ORDER — LIDOCAINE 2% (20 MG/ML) 5 ML SYRINGE
INTRAMUSCULAR | Status: DC | PRN
  Administered 2022-02-04: 60 mg via INTRAVENOUS

## 2022-02-04 MED ORDER — FENTANYL CITRATE PF 50 MCG/ML IJ SOSY
PREFILLED_SYRINGE | INTRAMUSCULAR | Status: AC
  Administered 2022-02-04: 25 ug via INTRAVENOUS
  Filled 2022-02-04: qty 2

## 2022-02-04 MED ORDER — EPHEDRINE SULFATE-NACL 50-0.9 MG/10ML-% IV SOSY
PREFILLED_SYRINGE | INTRAVENOUS | Status: DC | PRN
  Administered 2022-02-04: 10 mg via INTRAVENOUS

## 2022-02-04 MED ORDER — SPY AGENT GREEN - (INDOCYANINE FOR INJECTION)
INTRAMUSCULAR | Status: DC | PRN
Start: 2022-02-04 — End: 2022-02-04
  Administered 2022-02-04: 2 mL via INTRAVENOUS

## 2022-02-04 MED ORDER — ONDANSETRON HCL 4 MG/2ML IJ SOLN
INTRAMUSCULAR | Status: DC | PRN
  Administered 2022-02-04: 4 mg via INTRAVENOUS

## 2022-02-04 MED ORDER — CHLORHEXIDINE GLUCONATE CLOTH 2 % EX PADS: 6.0000 | MEDICATED_PAD | Freq: Once | CUTANEOUS | Status: DC

## 2022-02-04 MED ORDER — PROPOFOL 10 MG/ML IV BOLUS
INTRAVENOUS | Status: DC | PRN
  Administered 2022-02-04: 150 mg via INTRAVENOUS

## 2022-02-04 MED ORDER — CHLORHEXIDINE GLUCONATE 0.12 % MT SOLN: 15.0000 mL | Freq: Once | OROMUCOSAL | Status: AC

## 2022-02-04 MED ORDER — ALUM & MAG HYDROXIDE-SIMETH 200-200-20 MG/5ML PO SUSP
30.0000 mL | Freq: Four times a day (QID) | ORAL | Status: DC | PRN
  Administered 2022-02-04: 30 mL via ORAL
  Filled 2022-02-04: qty 30

## 2022-02-04 MED ORDER — ENSURE SURGERY PO LIQD
237.0000 mL | Freq: Two times a day (BID) | ORAL | Status: DC
  Administered 2022-02-04 – 2022-02-05 (×3): 237 mL via ORAL

## 2022-02-04 MED ORDER — DIPHENHYDRAMINE HCL 12.5 MG/5ML PO ELIX: 12.5000 mg | ORAL_SOLUTION | Freq: Four times a day (QID) | ORAL | Status: DC | PRN

## 2022-02-04 MED ORDER — LACTATED RINGERS IV SOLN: INTRAVENOUS | Status: DC

## 2022-02-04 MED ORDER — ENSURE PRE-SURGERY PO LIQD
592.0000 mL | Freq: Once | ORAL | Status: DC
  Filled 2022-02-04: qty 592

## 2022-02-04 MED ORDER — IBUPROFEN 400 MG PO TABS
600.0000 mg | ORAL_TABLET | Freq: Four times a day (QID) | ORAL | Status: DC | PRN
  Administered 2022-02-04 – 2022-02-05 (×3): 600 mg via ORAL
  Filled 2022-02-04 (×3): qty 1

## 2022-02-04 MED ORDER — LIDOCAINE HCL (PF) 2 % IJ SOLN
INTRAMUSCULAR | Status: AC
  Filled 2022-02-04: qty 5

## 2022-02-04 MED ORDER — ENSURE PRE-SURGERY PO LIQD
296.0000 mL | Freq: Once | ORAL | Status: DC
  Filled 2022-02-04: qty 296

## 2022-02-04 MED ORDER — BUPIVACAINE LIPOSOME 1.3 % IJ SUSP: 20.0000 mL | Freq: Once | INTRAMUSCULAR | Status: DC

## 2022-02-04 MED ORDER — BISACODYL 5 MG PO TBEC: 20.0000 mg | DELAYED_RELEASE_TABLET | Freq: Once | ORAL | Status: DC

## 2022-02-04 MED ORDER — AMISULPRIDE (ANTIEMETIC) 5 MG/2ML IV SOLN: 10.0000 mg | Freq: Once | INTRAVENOUS | Status: DC | PRN

## 2022-02-04 MED ORDER — LACTATED RINGERS IR SOLN
Status: DC | PRN
  Administered 2022-02-04: 1000 mL

## 2022-02-04 MED ORDER — BUPIVACAINE LIPOSOME 1.3 % IJ SUSP
INTRAMUSCULAR | Status: AC
  Filled 2022-02-04: qty 20

## 2022-02-04 MED ORDER — KETAMINE HCL 10 MG/ML IJ SOLN
INTRAMUSCULAR | Status: DC | PRN
  Administered 2022-02-04: 20 mg via INTRAVENOUS
  Administered 2022-02-04: 30 mg via INTRAVENOUS

## 2022-02-04 MED ORDER — FENTANYL CITRATE (PF) 250 MCG/5ML IJ SOLN
INTRAMUSCULAR | Status: AC
  Filled 2022-02-04: qty 5

## 2022-02-04 MED ORDER — SUGAMMADEX SODIUM 200 MG/2ML IV SOLN
INTRAVENOUS | Status: DC | PRN
  Administered 2022-02-04: 150 mg via INTRAVENOUS

## 2022-02-04 MED ORDER — FENTANYL CITRATE PF 50 MCG/ML IJ SOSY
PREFILLED_SYRINGE | INTRAMUSCULAR | Status: AC
  Administered 2022-02-04: 25 ug via INTRAVENOUS
  Filled 2022-02-04: qty 1

## 2022-02-04 MED ORDER — BUPIVACAINE-EPINEPHRINE (PF) 0.25% -1:200000 IJ SOLN
INTRAMUSCULAR | Status: AC
  Filled 2022-02-04: qty 30

## 2022-02-04 MED ORDER — FLUTICASONE PROPIONATE 50 MCG/ACT NA SUSP
2.0000 | Freq: Every day | NASAL | Status: DC | PRN
  Filled 2022-02-04: qty 16

## 2022-02-04 MED ORDER — ALVIMOPAN 12 MG PO CAPS
12.0000 mg | ORAL_CAPSULE | ORAL | Status: AC
  Administered 2022-02-04: 12 mg via ORAL
  Filled 2022-02-04: qty 1

## 2022-02-04 MED ORDER — HEPARIN SODIUM (PORCINE) 5000 UNIT/ML IJ SOLN
5000.0000 [IU] | Freq: Once | INTRAMUSCULAR | Status: AC
  Administered 2022-02-04: 5000 [IU] via SUBCUTANEOUS
  Filled 2022-02-04: qty 1

## 2022-02-04 MED ORDER — SCOPOLAMINE 1 MG/3DAYS TD PT72
1.0000 | MEDICATED_PATCH | TRANSDERMAL | Status: DC
  Administered 2022-02-04: 1.5 mg via TRANSDERMAL
  Filled 2022-02-04: qty 1

## 2022-02-04 MED ORDER — FENTANYL CITRATE (PF) 250 MCG/5ML IJ SOLN
INTRAMUSCULAR | Status: DC | PRN
  Administered 2022-02-04: 50 ug via INTRAVENOUS
  Administered 2022-02-04: 100 ug via INTRAVENOUS
  Administered 2022-02-04: 50 ug via INTRAVENOUS

## 2022-02-04 MED ORDER — NEOMYCIN SULFATE 500 MG PO TABS: 1000.0000 mg | ORAL_TABLET | ORAL | Status: DC

## 2022-02-04 MED ORDER — ACETAMINOPHEN 500 MG PO TABS
1000.0000 mg | ORAL_TABLET | ORAL | Status: AC
  Administered 2022-02-04: 1000 mg via ORAL
  Filled 2022-02-04: qty 2

## 2022-02-04 MED ORDER — ONDANSETRON HCL 4 MG/2ML IJ SOLN
INTRAMUSCULAR | Status: AC
  Filled 2022-02-04: qty 2

## 2022-02-04 MED ORDER — STERILE WATER FOR INJECTION IJ SOLN
INTRAMUSCULAR | Status: AC
Start: 2022-02-04 — End: ?
  Filled 2022-02-04: qty 10

## 2022-02-04 MED ORDER — EPHEDRINE 5 MG/ML INJ
INTRAVENOUS | Status: AC
  Filled 2022-02-04: qty 5

## 2022-02-04 MED ORDER — ONDANSETRON HCL 4 MG PO TABS
4.0000 mg | ORAL_TABLET | Freq: Four times a day (QID) | ORAL | Status: DC | PRN
  Administered 2022-02-04: 4 mg via ORAL
  Filled 2022-02-04: qty 1

## 2022-02-04 MED ORDER — ONDANSETRON HCL 4 MG/2ML IJ SOLN: 4.0000 mg | Freq: Four times a day (QID) | INTRAMUSCULAR | Status: DC | PRN

## 2022-02-04 MED ORDER — MIDAZOLAM HCL 2 MG/2ML IJ SOLN
INTRAMUSCULAR | Status: AC
  Filled 2022-02-04: qty 2

## 2022-02-04 MED ORDER — ROCURONIUM BROMIDE 10 MG/ML (PF) SYRINGE
PREFILLED_SYRINGE | INTRAVENOUS | Status: AC
  Filled 2022-02-04: qty 10

## 2022-02-04 MED ORDER — LIDOCAINE HCL 2 % IJ SOLN
INTRAMUSCULAR | Status: AC
  Filled 2022-02-04: qty 20

## 2022-02-04 MED ORDER — LORATADINE 10 MG PO TABS
10.0000 mg | ORAL_TABLET | Freq: Every evening | ORAL | Status: DC
  Administered 2022-02-04 – 2022-02-05 (×2): 10 mg via ORAL
  Filled 2022-02-04 (×2): qty 1

## 2022-02-04 MED ORDER — ACETAMINOPHEN 500 MG PO TABS
1000.0000 mg | ORAL_TABLET | Freq: Four times a day (QID) | ORAL | Status: DC
  Administered 2022-02-04 – 2022-02-06 (×6): 1000 mg via ORAL
  Filled 2022-02-04 (×6): qty 2

## 2022-02-04 MED ORDER — SIMETHICONE 80 MG PO CHEW
40.0000 mg | CHEWABLE_TABLET | Freq: Four times a day (QID) | ORAL | Status: DC | PRN
  Administered 2022-02-04: 40 mg via ORAL
  Filled 2022-02-04: qty 1

## 2022-02-04 MED ORDER — FENTANYL CITRATE PF 50 MCG/ML IJ SOSY
25.0000 ug | PREFILLED_SYRINGE | INTRAMUSCULAR | Status: DC | PRN
  Administered 2022-02-04 (×2): 50 ug via INTRAVENOUS

## 2022-02-04 MED ORDER — LEVOCETIRIZINE DIHYDROCHLORIDE 5 MG PO TABS: 5.0000 mg | ORAL_TABLET | Freq: Every evening | ORAL | Status: DC

## 2022-02-04 MED ORDER — TRAMADOL HCL 50 MG PO TABS
50.0000 mg | ORAL_TABLET | Freq: Four times a day (QID) | ORAL | Status: DC | PRN
  Administered 2022-02-04 – 2022-02-06 (×6): 50 mg via ORAL
  Filled 2022-02-04 (×5): qty 1

## 2022-02-04 MED ORDER — ORAL CARE MOUTH RINSE
15.0000 mL | Freq: Once | OROMUCOSAL | Status: AC
  Administered 2022-02-04: 15 mL via OROMUCOSAL

## 2022-02-04 MED ORDER — SODIUM CHLORIDE 0.9 % IV SOLN
2.0000 g | INTRAVENOUS | Status: AC
  Administered 2022-02-04: 2 g via INTRAVENOUS
  Filled 2022-02-04: qty 2

## 2022-02-04 MED ORDER — KETAMINE HCL 10 MG/ML IJ SOLN
INTRAMUSCULAR | Status: AC
  Filled 2022-02-04: qty 1

## 2022-02-04 MED ORDER — LACTATED RINGERS IV SOLN: INTRAVENOUS | Status: DC | PRN

## 2022-02-04 MED ORDER — ROCURONIUM BROMIDE 10 MG/ML (PF) SYRINGE
PREFILLED_SYRINGE | INTRAVENOUS | Status: DC | PRN
  Administered 2022-02-04: 20 mg via INTRAVENOUS
  Administered 2022-02-04: 100 mg via INTRAVENOUS
  Administered 2022-02-04: 5 mg via INTRAVENOUS

## 2022-02-04 MED ORDER — POLYETHYLENE GLYCOL 3350 17 GM/SCOOP PO POWD: 1.0000 | Freq: Once | ORAL | Status: DC

## 2022-02-04 MED ORDER — ALVIMOPAN 12 MG PO CAPS
12.0000 mg | ORAL_CAPSULE | Freq: Two times a day (BID) | ORAL | Status: DC
  Administered 2022-02-05: 12 mg via ORAL
  Filled 2022-02-04 (×2): qty 1

## 2022-02-04 MED ORDER — HEPARIN SODIUM (PORCINE) 5000 UNIT/ML IJ SOLN
5000.0000 [IU] | Freq: Three times a day (TID) | INTRAMUSCULAR | Status: DC
  Administered 2022-02-04 – 2022-02-06 (×6): 5000 [IU] via SUBCUTANEOUS
  Filled 2022-02-04 (×6): qty 1

## 2022-02-04 MED ORDER — LIDOCAINE 2% (20 MG/ML) 5 ML SYRINGE
INTRAMUSCULAR | Status: DC | PRN
  Administered 2022-02-04: 1.5 mg/kg/h via INTRAVENOUS

## 2022-02-04 MED ORDER — TRAMADOL HCL 50 MG PO TABS
ORAL_TABLET | ORAL | Status: AC
  Filled 2022-02-04: qty 1

## 2022-02-04 MED ORDER — DEXAMETHASONE SODIUM PHOSPHATE 10 MG/ML IJ SOLN
INTRAMUSCULAR | Status: AC
  Filled 2022-02-04: qty 1

## 2022-02-04 MED ORDER — SODIUM CHLORIDE (PF) 0.9 % IJ SOLN
INTRAMUSCULAR | Status: AC
  Filled 2022-02-04: qty 10

## 2022-02-04 MED ORDER — SODIUM CHLORIDE 0.9 % IR SOLN
Status: DC | PRN
  Administered 2022-02-04: 1000 mL

## 2022-02-04 MED ORDER — BUPIVACAINE-EPINEPHRINE (PF) 0.25% -1:200000 IJ SOLN
INTRAMUSCULAR | Status: DC | PRN
Start: 2022-02-04 — End: 2022-02-04
  Administered 2022-02-04: 30 mL via PERINEURAL

## 2022-02-04 MED ORDER — STERILE WATER FOR INJECTION IJ SOLN
INTRAMUSCULAR | Status: DC | PRN
  Administered 2022-02-04: 15 mL via INTRAVESICAL

## 2022-02-04 MED ORDER — ROCURONIUM BROMIDE 10 MG/ML (PF) SYRINGE
PREFILLED_SYRINGE | INTRAVENOUS | Status: AC
Start: 2022-02-04 — End: ?
  Filled 2022-02-04: qty 10

## 2022-02-04 MED ORDER — HYDROMORPHONE HCL 1 MG/ML IJ SOLN: 0.5000 mg | INTRAMUSCULAR | Status: DC | PRN

## 2022-02-04 SURGICAL SUPPLY — 131 items
ADAPTER GOLDBERG URETERAL (ADAPTER) IMPLANT
APPLIER CLIP 5 13 M/L LIGAMAX5 (MISCELLANEOUS)
APPLIER CLIP ROT 10 11.4 M/L (STAPLE)
BAG COUNTER SPONGE SURGICOUNT (BAG) IMPLANT
BAG URO CATCHER STRL LF (MISCELLANEOUS) ×2 IMPLANT
BLADE EXTENDED COATED 6.5IN (ELECTRODE) ×2 IMPLANT
CANNULA REDUC XI 12-8 STAPL (CANNULA) ×1
CANNULA REDUCER 12-8 DVNC XI (CANNULA) ×1 IMPLANT
CATH URETL OPEN 5X70 (CATHETERS) IMPLANT
CELLS DAT CNTRL 66122 CELL SVR (MISCELLANEOUS) IMPLANT
CHLORAPREP W/TINT 26 (MISCELLANEOUS) ×2 IMPLANT
CLIP APPLIE 5 13 M/L LIGAMAX5 (MISCELLANEOUS) IMPLANT
CLIP APPLIE ROT 10 11.4 M/L (STAPLE) IMPLANT
CLIP LIGATING HEM O LOK PURPLE (MISCELLANEOUS) IMPLANT
CLIP LIGATING HEMO O LOK GREEN (MISCELLANEOUS) IMPLANT
CLOTH BEACON ORANGE TIMEOUT ST (SAFETY) ×2 IMPLANT
COVER SURGICAL LIGHT HANDLE (MISCELLANEOUS) ×4 IMPLANT
COVER TIP SHEARS 8 DVNC (MISCELLANEOUS) ×1 IMPLANT
COVER TIP SHEARS 8MM DA VINCI (MISCELLANEOUS) ×1
DEFOGGER SCOPE WARMER CLEARIFY (MISCELLANEOUS) ×2 IMPLANT
DERMABOND ADVANCED (GAUZE/BANDAGES/DRESSINGS) ×1
DERMABOND ADVANCED .7 DNX12 (GAUZE/BANDAGES/DRESSINGS) IMPLANT
DEVICE TROCAR PUNCTURE CLOSURE (ENDOMECHANICALS) IMPLANT
DRAIN CHANNEL 19F RND (DRAIN) ×2 IMPLANT
DRAPE ARM DVNC X/XI (DISPOSABLE) ×4 IMPLANT
DRAPE COLUMN DVNC XI (DISPOSABLE) ×1 IMPLANT
DRAPE DA VINCI XI ARM (DISPOSABLE) ×4
DRAPE DA VINCI XI COLUMN (DISPOSABLE) ×1
DRAPE SURG IRRIG POUCH 19X23 (DRAPES) ×2 IMPLANT
DRSG OPSITE POSTOP 4X10 (GAUZE/BANDAGES/DRESSINGS) IMPLANT
DRSG OPSITE POSTOP 4X6 (GAUZE/BANDAGES/DRESSINGS) IMPLANT
DRSG OPSITE POSTOP 4X8 (GAUZE/BANDAGES/DRESSINGS) IMPLANT
DRSG TEGADERM 2-3/8X2-3/4 SM (GAUZE/BANDAGES/DRESSINGS) ×10 IMPLANT
DRSG TEGADERM 4X4.75 (GAUZE/BANDAGES/DRESSINGS) ×2 IMPLANT
ELECT REM PT RETURN 15FT ADLT (MISCELLANEOUS) ×2 IMPLANT
ENDOLOOP SUT PDS II  0 18 (SUTURE)
ENDOLOOP SUT PDS II 0 18 (SUTURE) IMPLANT
EVACUATOR SILICONE 100CC (DRAIN) ×2 IMPLANT
GAUZE SPONGE 2X2 8PLY STRL LF (GAUZE/BANDAGES/DRESSINGS) ×1 IMPLANT
GAUZE SPONGE 4X4 12PLY STRL (GAUZE/BANDAGES/DRESSINGS) IMPLANT
GLOVE BIO SURGEON STRL SZ7.5 (GLOVE) ×6 IMPLANT
GLOVE INDICATOR 8.0 STRL GRN (GLOVE) ×6 IMPLANT
GLOVE SURG LX 7.5 STRW (GLOVE) ×1
GLOVE SURG LX STRL 7.5 STRW (GLOVE) ×1 IMPLANT
GOWN STRL REUS W/ TWL XL LVL3 (GOWN DISPOSABLE) ×5 IMPLANT
GOWN STRL REUS W/TWL XL LVL3 (GOWN DISPOSABLE) ×5
GRASPER SUT TROCAR 14GX15 (MISCELLANEOUS) IMPLANT
GUIDEWIRE ANG ZIPWIRE 038X150 (WIRE) IMPLANT
GUIDEWIRE STR DUAL SENSOR (WIRE) IMPLANT
HOLDER FOLEY CATH W/STRAP (MISCELLANEOUS) ×2 IMPLANT
IRRIG SUCT STRYKERFLOW 2 WTIP (MISCELLANEOUS) ×2
IRRIGATION SUCT STRKRFLW 2 WTP (MISCELLANEOUS) ×1 IMPLANT
KIT PROCEDURE DA VINCI SI (MISCELLANEOUS) ×1
KIT PROCEDURE DVNC SI (MISCELLANEOUS) ×1 IMPLANT
KIT TURNOVER KIT A (KITS) IMPLANT
MANIFOLD NEPTUNE II (INSTRUMENTS) ×2 IMPLANT
NDL INSUFFLATION 14GA 120MM (NEEDLE) ×1 IMPLANT
NDL SAFETY ECLIPSE 18X1.5 (NEEDLE) ×1 IMPLANT
NEEDLE HYPO 18GX1.5 SHARP (NEEDLE) ×1
NEEDLE INSUFFLATION 14GA 120MM (NEEDLE) ×2 IMPLANT
PACK CARDIOVASCULAR III (CUSTOM PROCEDURE TRAY) ×2 IMPLANT
PACK COLON (CUSTOM PROCEDURE TRAY) ×2 IMPLANT
PACK CYSTO (CUSTOM PROCEDURE TRAY) ×2 IMPLANT
PAD POSITIONING PINK XL (MISCELLANEOUS) ×2 IMPLANT
PENCIL SMOKE EVACUATOR (MISCELLANEOUS) IMPLANT
PROTECTOR NERVE ULNAR (MISCELLANEOUS) ×4 IMPLANT
RELOAD STAPLE 45 3.5 BLU DVNC (STAPLE) IMPLANT
RELOAD STAPLE 45 4.3 GRN DVNC (STAPLE) IMPLANT
RELOAD STAPLE 60 3.5 BLU DVNC (STAPLE) IMPLANT
RELOAD STAPLE 60 4.3 GRN DVNC (STAPLE) IMPLANT
RELOAD STAPLER 3.5X45 BLU DVNC (STAPLE) IMPLANT
RELOAD STAPLER 3.5X60 BLU DVNC (STAPLE) IMPLANT
RELOAD STAPLER 4.3X45 GRN DVNC (STAPLE) IMPLANT
RELOAD STAPLER 4.3X60 GRN DVNC (STAPLE) ×1 IMPLANT
RETRACTOR WND ALEXIS 18 MED (MISCELLANEOUS) IMPLANT
RTRCTR WOUND ALEXIS 18CM MED (MISCELLANEOUS)
SCISSORS LAP 5X35 DISP (ENDOMECHANICALS) IMPLANT
SEAL CANN UNIV 5-8 DVNC XI (MISCELLANEOUS) ×4 IMPLANT
SEAL XI 5MM-8MM UNIVERSAL (MISCELLANEOUS) ×4
SEALER VESSEL DA VINCI XI (MISCELLANEOUS) ×1
SEALER VESSEL EXT DVNC XI (MISCELLANEOUS) ×1 IMPLANT
SLEEVE ADV FIXATION 5X100MM (TROCAR) IMPLANT
SOLUTION ELECTROLUBE (MISCELLANEOUS) ×2 IMPLANT
SPIKE FLUID TRANSFER (MISCELLANEOUS) ×2 IMPLANT
SPONGE GAUZE 2X2 STER 10/PKG (GAUZE/BANDAGES/DRESSINGS) ×1
STAPLER 60 DA VINCI SURE FORM (STAPLE) ×1
STAPLER 60 SUREFORM DVNC (STAPLE) IMPLANT
STAPLER CANNULA SEAL DVNC XI (STAPLE) ×1 IMPLANT
STAPLER CANNULA SEAL XI (STAPLE) ×1
STAPLER ECHELON POWER CIR 29 (STAPLE) ×1 IMPLANT
STAPLER ECHELON POWER CIR 31 (STAPLE) IMPLANT
STAPLER RELOAD 3.5X45 BLU DVNC (STAPLE)
STAPLER RELOAD 3.5X45 BLUE (STAPLE)
STAPLER RELOAD 3.5X60 BLU DVNC (STAPLE)
STAPLER RELOAD 3.5X60 BLUE (STAPLE)
STAPLER RELOAD 4.3X45 GREEN (STAPLE)
STAPLER RELOAD 4.3X45 GRN DVNC (STAPLE)
STAPLER RELOAD 4.3X60 GREEN (STAPLE) ×1
STAPLER RELOAD 4.3X60 GRN DVNC (STAPLE) ×1
STOPCOCK 4 WAY LG BORE MALE ST (IV SETS) ×4 IMPLANT
SURGILUBE 2OZ TUBE FLIPTOP (MISCELLANEOUS) ×2 IMPLANT
SUT MNCRL AB 4-0 PS2 18 (SUTURE) ×2 IMPLANT
SUT PDS AB 1 CT1 27 (SUTURE) IMPLANT
SUT PDS AB 1 TP1 96 (SUTURE) IMPLANT
SUT PROLENE 0 CT 2 (SUTURE) IMPLANT
SUT PROLENE 2 0 KS (SUTURE) ×2 IMPLANT
SUT PROLENE 2 0 SH DA (SUTURE) IMPLANT
SUT SILK 2 0 (SUTURE)
SUT SILK 2 0 SH CR/8 (SUTURE) IMPLANT
SUT SILK 2-0 18XBRD TIE 12 (SUTURE) IMPLANT
SUT SILK 3 0 (SUTURE) ×1
SUT SILK 3 0 SH CR/8 (SUTURE) ×2 IMPLANT
SUT SILK 3-0 18XBRD TIE 12 (SUTURE) ×1 IMPLANT
SUT V-LOC BARB 180 2/0GR6 GS22 (SUTURE)
SUT VIC AB 3-0 SH 18 (SUTURE) IMPLANT
SUT VIC AB 3-0 SH 27 (SUTURE)
SUT VIC AB 3-0 SH 27XBRD (SUTURE) IMPLANT
SUT VICRYL 0 UR6 27IN ABS (SUTURE) ×2 IMPLANT
SUTURE V-LC BRB 180 2/0GR6GS22 (SUTURE) IMPLANT
SYR 10ML LL (SYRINGE) ×2 IMPLANT
SYS LAPSCP GELPORT 120MM (MISCELLANEOUS)
SYS WOUND ALEXIS 18CM MED (MISCELLANEOUS) ×2
SYSTEM LAPSCP GELPORT 120MM (MISCELLANEOUS) IMPLANT
SYSTEM WOUND ALEXIS 18CM MED (MISCELLANEOUS) ×1 IMPLANT
TAPE UMBILICAL 1/8 X36 TWILL (MISCELLANEOUS) ×2 IMPLANT
TOWEL OR NON WOVEN STRL DISP B (DISPOSABLE) ×2 IMPLANT
TRAY FOLEY MTR SLVR 16FR STAT (SET/KITS/TRAYS/PACK) ×2 IMPLANT
TROCAR ADV FIXATION 5X100MM (TROCAR) ×2 IMPLANT
TUBING CONNECTING 10 (TUBING) ×6 IMPLANT
TUBING INSUFFLATION 10FT LAP (TUBING) ×2 IMPLANT
TUBING UROLOGY SET (TUBING) IMPLANT

## 2022-02-04 NOTE — Op Note (Signed)
PATIENT: Jaclyn York  66 y.o. female ? ?Patient Care Team: ?Wayland Salinas, MD as PCP - General (Family Medicine) ? ?PREOP DIAGNOSIS: DIVERTICULITIS ? ?POSTOP DIAGNOSIS: DIVERTICULITIS ? ?PROCEDURE:  ?Robotic assisted low anterior resection with double stapled colorectal anastomosis ?Intraoperative assessment of perfusion using ICG fluorescence imaging ?Flexible sigmoidoscopy ?Bilateral transversus abdominus plane (TAP) blocks ? ?SURGEON: Sharon Mt. Kailin Principato, MD ? ?ASSISTANT: Leighton Ruff, MD ? ?ANESTHESIA: General endotracheal ? ?EBL: 25 mL ?Total I/O ?In: 1000 [I.V.:1000] ?Out: 205 [Urine:180; Blood:25] ? ?SPECIMEN: Rectosigmoid colon - open end proximal ? ?COUNTS: Sponge, needle and instrument counts were reported correct x2 ? ?FINDINGS: Significant scarring of the mid and distal sigmoid consistent with history. Pelvic adhesions between sigmoid colon, proximal most rectum and the tubes/ovaries. These were all taken down. The distal point of transection was at the proximal rectum just below the diseased segment. A well perfused, tension free, hemostatic, air tight 29 mm EEA colorectal anastomosis fashioned 14 cm from the anal verge by flexible sigmoidoscopy. ? ? ?NARRATIVE: ?Informed consent was verified. The patient was taken to the operating room, placed supine on the operating table and SCD's were applied. General endotracheal anesthesia was induced without difficulty. She was then positioned in the lithotomy position with Allen stirrups.  Pressure points were evaluated and padded.  A foley catheter was then placed by nursing under sterile conditions. Hair on the abdomen was clipped. She was secured to the operating table. Dr. Louis Meckel with [facility] scrubbed and instilled ICG into the ureters uneventfully. The abdomen was then prepped and draped in the standard sterile fashion. Surgical timeout was called indicating the correct patient, procedure, positioning and need for preoperative  antibiotics. ?  ?An OG tube was placed by anesthesia and confirmed to be to suction.  At Palmer's point, a stab incision was created and the Veress needle was introduced into the peritoneal cavity on the first attempt.  Intraperitoneal location was confirmed by the aspiration and saline drop test.  Pneumoperitoneum was established to a maximum pressure of 15 mmHg using CO2.  Following this, the abdomen was marked for planned trocar sites.  Just to the right and cephalad to the umbilicus, an 8 mm incision was created and an 8 mm blunt tipped robotic trocar was cautiously placed into the peritoneal cavity.  The laparoscope was inserted and demonstrated no evidence of trocar site nor Veress needle site complications.  The Veress needle was removed.  Bilateral transversus abdominis plane blocks were then created using a dilute mixture of Exparel with Marcaine.  3 additional 8 mm robotic trochars were placed under direct visualization roughly in a line extending from the right ASIS towards the left upper quadrant. The bladder was inspected and noted to be at/below the pubic symphysis.  Staying 3 fingerbreadths above the pubic symphysis, an incision was created and the 12 mm robotic trocar inserted directed cephalad into the peritoneal cavity under direct visualization.  An additional 5 mm assist port was placed in the right lateral abdomen under direct visualization.  The abdomen was surveyed and there was pelvic adhesions as well as sigmoidal adhesions to the left abdominal wall consistent with history of diverticulitis. She was positioned in Trendelenburg with the left side tilted slightly up.  Small bowel was carefully retracted out of the pelvis.  The robot was then docked and I went to the console. ? ?We began with adhesiolysis. Adhesions consisting of sigmoid colon and omentum were freed from the pelvis. There are adhesions of the sigmoid to the  left fallopian tube also cleared. This allowed the sigmoid to be  retracted out of the pelvis. ?  ?Lateral attachments of the sigmoid colon were taken down from the intersigmoid fossa.  The rectosigmoid colon was grasped and elevated anteriorly.  Beginning with a medial to lateral approach, the peritoneum overlying the presacral space was carefully incised.  The TME plane was readily gained working in a plane between the fascia propria of the rectum and the presacral fascia.  Hypogastric nerves were seen going along the the presacral fascia and were protected free of injury.  Working more proximally, the mesorectum and sigmoid mesentery were carefully mobilized off of the peritoneum.  The left ureter was identified and protected free of injury.  The left gonadal vessels were identified and protected.  These were both swept "down."  The superior hemorrhoidal and IMA pedicles were identified. Further mesocolon was mobilized proximally staying in this plane between the retroperitoneum proper and the mesocolon. Attention was then turned to the lateral portion of dissection.  The sigmoid colon was then retracted to the right.  The sigmoid colon was fully mobilized. The descending colon was mobilized by incising the Honora Searson line of Toldt.  This was done all the way up to the level of the splenic flexure. ? ?The associated mesocolon was also mobilized medially.  The left ureter again was confirmed to be well away from the vasculature which had been dissected medially.  The rectosigmoid colon was elevated anteriorly. The left ureter was re-identified. The IMA was clear of this. The IMA was then divided with the vessel sealer. The stump was inspected and noted to be completely hemostatic with a good seal.  The mesentery was divided out to the point of planned proximal division. ? ?Working more distally, the rectum was identified where the tinea had splayed and there were loss of appendices epiploica.  This also corresponded to a location overlying the sacral promontory.  Anatomically, this  clearly represents the proximal rectum.  The mesentery out to this level was then cleared using the vessel sealer. The distal point of transection on the proximal rectum was identified.   A 60 mm green load robotic stapler was then placed through the 12 mm port and introduced into the peritoneal cavity.  The rectum was divided with 1 firing of the stapler.  The stump was intact and healthy in appearance. ?  ?Attention was turned to performing a perfusion test. ICG was administered by our anesthesia team and at the level of the cleared mesentery proximally, there is excellent uptake of the tracer.  The rectum is well perfused in appearance.  There is a visible pulse in the mesentery out to the level of the cleared colon at the level of the proximal sigmoid/descending colon junction.  This colon is also supple and healthy in appearance without any thickening.  This reaches into the pelvis without any difficulty and remains in that location without any tension. A locking grasper is then placed on the sigmoid staple line. ?  ?Attention was turned to the extracorporeal portion of the procedure.  The robot was undocked.  I scrubbed back in.  Using the 12 mm trocar site, a Pfannenstiel incision was created and incorporated the fascial opening through the 12 mm port site.  The rectus fascia was incised and then elevated.  The rectus muscle was mobilized free of the overlying fascia.  The peritoneum was incised in the midline well above the location of the bladder.  An IT sales professional  was placed.  Towels were placed around the field.  The divided colon was passed through the wound protector.  The point of proximal division was identified and was again on a healthy segment of supple colon with a palpable pulse in the mesentery. This was pink in color.  A pursestring device was applied.  A 2-0 Prolene on a Keith needle was passed.  The colon was divided and passed off with the open end being proximal.  EEA sizers were  then introduced and a 29 mm EEA selected.  "Belt loops" consisting of 3-0 silk were placed around the pursestring suture line.  The anvil was placed and the pursestring tied.  A small amount of fat was cleared fro

## 2022-02-04 NOTE — Anesthesia Procedure Notes (Addendum)
Procedure Name: Intubation ?Date/Time: 02/04/2022 7:50 AM ?Performed by: Sharlette Dense, CRNA ?Pre-anesthesia Checklist: Patient identified, Emergency Drugs available, Suction available and Patient being monitored ?Patient Re-evaluated:Patient Re-evaluated prior to induction ?Oxygen Delivery Method: Circle system utilized ?Preoxygenation: Pre-oxygenation with 100% oxygen ?Induction Type: IV induction ?Ventilation: Mask ventilation without difficulty and Oral airway inserted - appropriate to patient size ?Laryngoscope Size: Sabra Heck and 2 ?Grade View: Grade I ?Tube type: Oral ?Tube size: 7.5 mm ?Number of attempts: 1 ?Airway Equipment and Method: Stylet and Oral airway ?Placement Confirmation: ETT inserted through vocal cords under direct vision, positive ETCO2 and breath sounds checked- equal and bilateral ?Secured at: 22 cm ?Tube secured with: Tape ?Dental Injury: Teeth and Oropharynx as per pre-operative assessment  ? ? ? ? ?

## 2022-02-04 NOTE — Op Note (Signed)
Preoperative diagnosis:  ?Pelvic Abscess  ? Diverticulitis ?Postoperative diagnosis:  ?Same ?  ?Procedure: ?Cystoscopy ?Instillation of ureteral firefly constrast ?  ?Surgeon: Ardis Hughs, MD ?  ?Anesthesia: General ?  ?Complications: None ?  ?Intraoperative findings: Normal bladder mucosa without evidence of fistula, ureteral orifices orthotopic. ? ? ?EBL: Minimal ?  ?Specimens: None ?  ?Indication:  Jaclyn York  is a 66 y.o.  patient with diverticular abscess.  Dr. Dema Severin requested cystoscopy and firefly instillation to help facilitate the dissection of the sigmoid colon.  After reviewing the management options for treatment, she elected to proceed with the above surgical procedure(s). We have discussed the potential benefits and risks of the procedure, side effects of the proposed treatment, the likelihood of the patient achieving the goals of the procedure, and any potential problems that might occur during the procedure or recuperation. Informed consent has been obtained. ?  ?Description of procedure: ?  ?The patient was taken to the operating room and general anesthesia was induced.  The patient was placed in the dorsal lithotomy position, prepped and draped in the usual sterile fashion, and preoperative antibiotics were administered. A preoperative time-out was performed.  ?  ?A 21 French 30 degree cystoscope was gently passed through the patient's urethra into the bladder.  The bladder was subsequently emptied and then filled slowly up performing a 360 degrees cystoscopic evaluation.  This demonstrated orthotopic ureteral orifices, normal bladder mucosa with an area of heaped mucosa and bullous edema in the dome -  evidence of colovesical fistula without mucosal abnormality. ?  ?I then advanced a 5 [place] open-ended ureteral catheter into the patient's left ureteral orifice and  I then advanced the catheter up into the proximal ureter and then slowly pulled back and injected 7.75m of the firefly  contrast.  Subsequently turned my attention to the patient's right ureteral orifice and performed a similar task.   I then  placed a 16 [place].  ?  ?The surgery was then turned over to Dr. WDema Severinfor facilitation of the remainder of the case.  ?

## 2022-02-04 NOTE — H&P (Signed)
I have been asked to see the patient by Dr. Nadeen Landau, to facilitate firefly injection into the patient's urinary tract. ? ?History of present illness: ?66 year old female with a history of complicated diverticulitis presents today for colectomy and low anterior resection.  As part of the operation Dr. Dema Severin has requested that we put contrast in the urinary tract to help facilitate the dissection.  The patient does have a history of recurrent urinary tract infections, but has not had an infection in a while.  She has no significant voiding symptoms. ? ?Review of systems: A 12 point comprehensive review of systems was obtained and is negative unless otherwise stated in the history of present illness. ? ?Patient Active Problem List  ? Diagnosis Date Noted  ? Sigmoid diverticulitis 08/29/2021  ? Ileus ([place]) 08/29/2021  ? Dehydration 08/29/2021  ? ? ?No current facility-administered medications on file prior to encounter.  ? ?Current Outpatient Medications on File Prior to Encounter  ?Medication Sig Dispense Refill  ? estradiol (VIVELLE-DOT) 0.025 MG/24HR Place 1 patch onto the skin 2 (two) times a week. Monday and Thursday    ? fluticasone (FLONASE) 50 MCG/ACT nasal spray Place 2 sprays into both nostrils daily as needed for allergies.    ? levocetirizine (XYZAL) 5 MG tablet Take 5 mg by mouth every evening.    ? Multiple Vitamin (MULTIVITAMIN WITH MINERALS) TABS tablet Take 1 tablet by mouth daily.    ? progesterone (PROMETRIUM) 100 MG capsule Take 100 mg by mouth at bedtime.    ? senna-docusate (SENOKOT-S) 8.6-50 MG tablet Take 1 tablet by mouth 2 (two) times daily as needed for mild constipation. 60 tablet 0  ? simethicone (MYLICON) 80 MG chewable tablet Chew 1 tablet (80 mg total) by mouth every 6 (six) hours as needed for flatulence. 30 tablet 0  ? ondansetron (ZOFRAN) 4 MG tablet Take 1 tablet (4 mg total) by mouth every 6 (six) hours as needed for nausea. 20 tablet 0  ? ? ?Past Medical History:   ?Diagnosis Date  ? Diverticulosis   ? ? ?Past Surgical History:  ?Procedure Laterality Date  ? IR CATHETER TUBE CHANGE  09/30/2021  ? IR RADIOLOGIST EVAL & MGMT  09/17/2021  ? IR RADIOLOGIST EVAL & MGMT  09/29/2021  ? ? ?Social History  ? ?Tobacco Use  ? Smoking status: Former  ?  Types: Cigarettes  ? Smokeless tobacco: Never  ?Vaping Use  ? Vaping Use: Never used  ?Substance Use Topics  ? Alcohol use: Yes  ?  Comment: SOCIALLY  ? Drug use: Never  ? ? ?Family History  ?Problem Relation Age of Onset  ? Diverticulosis Mother   ? Colon polyps Mother   ? Lung cancer Father   ? Esophageal cancer Father   ? Prostate cancer Father   ? Cancer Brother   ?     laryngeal  ? Breast cancer Cousin   ? Crohn's disease Cousin   ?     x 2  ? Breast cancer Paternal Aunt   ? Diabetes Paternal Aunt   ? Colon cancer Neg Hx   ? ? ?PE: ?Vitals:  ? 02/04/22 0552  ?BP: (!) 142/77  ?Pulse: 70  ?Resp: 16  ?Temp: 98.3 ?F (36.8 ?C)  ?TempSrc: Oral  ?SpO2: 94%  ? ?Patient appears to be in no acute distress  ?patient is alert and oriented x3 ?Atraumatic normocephalic head ?No cervical or supraclavicular lymphadenopathy appreciated ?No increased work of breathing, no audible wheezes/rhonchi ?Regular sinus rhythm/rate ?  Abdomen is soft, nontender, nondistended, no CVA or suprapubic tenderness ?Lower extremities are symmetric without appreciable edema ?Grossly neurologically intact ?No identifiable skin lesions ? ?No results for input(s): WBC, HGB, HCT in the last 72 hours. ?No results for input(s): NA, K, CL, CO2, GLUCOSE, BUN, CREATININE, CALCIUM in the last 72 hours. ?No results for input(s): LABPT, INR in the last 72 hours. ?No results for input(s): LABURIN in the last 72 hours. ?Results for orders placed or performed during the hospital encounter of 08/29/21  ?Resp Panel by RT-PCR (Flu A&B, Covid) Nasopharyngeal Swab     Status: None  ? Collection Time: 08/29/21 11:24 AM  ? Specimen: Nasopharyngeal Swab; Nasopharyngeal(NP) swabs in vial  transport medium  ?Result Value Ref Range Status  ? SARS Coronavirus 2 by RT PCR NEGATIVE NEGATIVE Final  ?  Comment: (NOTE) ?SARS-CoV-2 target nucleic acids are NOT DETECTED. ? ?The SARS-CoV-2 RNA is generally detectable in upper respiratory ?specimens during the acute phase of infection. The lowest ?concentration of SARS-CoV-2 viral copies this assay can detect is ?138 copies/mL. A negative result does not preclude SARS-Cov-2 ?infection and should not be used as the sole basis for treatment or ?other patient management decisions. A negative result may occur with  ?improper specimen collection/handling, submission of specimen other ?than nasopharyngeal swab, presence of viral mutation(s) within the ?areas targeted by this assay, and inadequate number of viral ?copies(<138 copies/mL). A negative result must be combined with ?clinical observations, patient history, and epidemiological ?information. The expected result is Negative. ? ?Fact Sheet for Patients:  ?EntrepreneurPulse.com.au ? ?Fact Sheet for Healthcare Providers:  ?IncredibleEmployment.be ? ?This test is no t yet approved or cleared by the [place] FDA and  ?has been authorized for detection and/or diagnosis of SARS-CoV-2 by ?FDA under an Emergency Use Authorization (EUA). This EUA will remain  ?in effect (meaning this test can be used) for the duration of the ?COVID-19 declaration under Section 564(b)(1) of the Act, 21 ?U.S.C.section 360bbb-3(b)(1), unless the authorization is terminated  ?or revoked sooner.  ? ? ?  ? Influenza A by PCR NEGATIVE NEGATIVE Final  ? Influenza B by PCR NEGATIVE NEGATIVE Final  ?  Comment: (NOTE) ?The Xpert Xpress SARS-CoV-2/FLU/RSV plus assay is intended as an aid ?in the diagnosis of influenza from Nasopharyngeal swab specimens and ?should not be used as a sole basis for treatment. Nasal washings and ?aspirates are unacceptable for Xpert Xpress SARS-CoV-2/FLU/RSV ?testing. ? ?Fact  Sheet for Patients: ?EntrepreneurPulse.com.au ? ?Fact Sheet for Healthcare Providers: ?IncredibleEmployment.be ? ?This test is not yet approved or cleared by the [place] FDA and ?has been authorized for detection and/or diagnosis of SARS-CoV-2 by ?FDA under an Emergency Use Authorization (EUA). This EUA will remain ?in effect (meaning this test can be used) for the duration of the ?COVID-19 declaration under Section 564(b)(1) of the Act, 21 U.S.C. ?section 360bbb-3(b)(1), unless the authorization is terminated or ?revoked. ? ?Performed at [facility], [address] [place], [place] [address] ?  ?Aerobic/Anaerobic Culture w Gram Stain (surgical/deep wound)     Status: None  ? Collection Time: 09/04/21  9:59 AM  ? Specimen: Abscess  ?Result Value Ref Range Status  ? Specimen Description   Final  ?  ABSCESS ?Performed at [facility], [place] [address], [place], [place] [address] ?  ? Special Requests   Final  ?  Normal ?Performed at [facility], [place] [address] [place], [place] [address] ?  ? Gram Stain  Final  ?  RARE WBC PRESENT,BOTH PMN AND MONONUCLEAR ?NO ORGANISMS SEEN ?  ? Culture   Final  ?  RARE PSEUDOMONAS AERUGINOSA ?NO ANAEROBES ISOLATED ?Performed at [facility], [place] [address] [place], [place] [address] ?  ? Report Status 09/10/2021 FINAL  Final  ? Organism ID, Bacteria PSEUDOMONAS AERUGINOSA  Final  ?    Susceptibility  ? Pseudomonas aeruginosa - MIC*  ?  CEFTAZIDIME 2 SENSITIVE Sensitive   ?  CIPROFLOXACIN <=0.25 SENSITIVE Sensitive   ?  GENTAMICIN <=1 SENSITIVE Sensitive   ?  IMIPENEM 2 SENSITIVE Sensitive   ?  PIP/TAZO 8 SENSITIVE Sensitive   ?  CEFEPIME 2 SENSITIVE Sensitive   ?  * RARE PSEUDOMONAS AERUGINOSA  ? ? ?Imaging: ?Have independently reviewed the patient's images from a CT scan on 11/13/2021 demonstrating no ongoing abscess within the pelvis and subsequent drain removal.  There is no  hydroureteronephrosis. ? ?Imp: The patient is otherwise relatively healthy 66 year old female with a history of complicated diverticulitis who presents today for colectomy.  As part of the operation Dr. Dema Severin has requested inject

## 2022-02-04 NOTE — Anesthesia Postprocedure Evaluation (Signed)
Anesthesia Post Note ? ?Patient: Jaclyn York ? ?Procedure(s) Performed: XI ROBOTIC ASSISTED LOWER ANTERIOR RESECTION, BILATERAL TAP BLOCK, FIREFLY INJECTION ?FLEXIBLE SIGMOIDOSCOPY ?CYSTOSCOPY with FIREFLY INJECTION ? ?  ? ?Patient location during evaluation: PACU ?Anesthesia Type: General ?Level of consciousness: sedated ?Pain management: pain level controlled ?Vital Signs Assessment: post-procedure vital signs reviewed and stable ?Respiratory status: spontaneous breathing and respiratory function stable ?Cardiovascular status: stable ?Postop Assessment: no apparent nausea or vomiting ?Anesthetic complications: no ? ? ?No notable events documented. ? ?Last Vitals:  ?Vitals:  ? 02/04/22 1130 02/04/22 1145  ?BP: (!) 118/54 104/61  ?Pulse: 77 76  ?Resp: 16 10  ?Temp:    ?SpO2: 100% 99%  ?  ?Last Pain:  ?Vitals:  ? 02/04/22 1145  ?TempSrc:   ?PainSc: 8   ? ? ?  ?  ?  ?  ?  ?  ? ?[name] ? ? ? ? ?

## 2022-02-04 NOTE — H&P (Signed)
? ?CC: diverticulitis here for surgery ? ?HPI: ?Jaclyn York is an 66 y.o. female with history of perforated diverticulitis. She was hospitalized 08/2021 for diverticulitis. Her presenting CT scan 08/29/2021 showed sigmoid diverticulitis with foci of air adjacent to this segment. She was on antibiotics in the hospital and had progressive symptoms. A repeat CT scan done 11/17 showed persistent changes of diverticulitis with multiple new pelvic abscesses. She underwent percutaneous drainage by interventional radiology 11/18. She improved and follow-up imaging 09/17/2021 demonstrated improved findings. The drains are quite uncomfortable. She ultimately underwent drain studies and was found to have a communication with an abscess cavity that communicated with a adjacent loop of sigmoid colon. This was on 09/29/2021. They had initially planned upsizing but due to the discomfort regarding her catheter they decided to remove this. This was done 09/30/2021. Follow-up imaging 11/13/2021 showed no evidence of recurrent or residual diverticulitis or abscess. Drain remains out. She saw Dr. Rosendo Gros whom referred her to see me. ? ?Colonoscopy was completed with Dr. Ardis Hughs 12/11/2021 2 small polyps were removed. Diverticulosis was found. Some mucosal edema, tortuosity, and narrowing was found. Previous sigmoid colon submucosal tattoo was seen. This may have been done during a previous colonoscopy. Hemorrhoids. ? ?She has been doing well since her colonoscopy and since her drain was removed. Denies any abdominal pain. No nausea or vomiting. Is having regular bowel movements. ? ?PMH: Diverticulitis ? ?PSH: She denies any prior abdominal surgical history including C-sections, tubal ligations, hysterectomy. ? ?FHx: Denies any known family history of colorectal, breast, endometrial or ovarian cancer ? ?Social Hx: Denies use of tobacco/illicit drug. Occasional EtOH use. Works in Engineer, technical sales for [employer]. Able to work from home for last 3  years. ? ?Review of Systems: ?A complete review of systems was obtained from the patient. I have reviewed this information and discussed as appropriate with the patient. See HPI as well for other ROS. ? ?Past Medical History:  ?Diagnosis Date  ? Diverticulosis   ? ? ?Past Surgical History:  ?Procedure Laterality Date  ? IR CATHETER TUBE CHANGE  09/30/2021  ? IR RADIOLOGIST EVAL & MGMT  09/17/2021  ? IR RADIOLOGIST EVAL & MGMT  09/29/2021  ? ? ?Family History  ?Problem Relation Age of Onset  ? Diverticulosis Mother   ? Colon polyps Mother   ? Lung cancer Father   ? Esophageal cancer Father   ? Prostate cancer Father   ? Cancer Brother   ?     laryngeal  ? Breast cancer Cousin   ? Crohn's disease Cousin   ?     x 2  ? Breast cancer Paternal Aunt   ? Diabetes Paternal Aunt   ? Colon cancer Neg Hx   ? ? ?Social:  reports that she has quit smoking. Her smoking use included cigarettes. She has never used smokeless tobacco. She reports current alcohol use. She reports that she does not use drugs. ? ?Allergies: No Known Allergies ? ?Medications: I have reviewed the patient's current medications. ? ?Results for orders placed or performed during the hospital encounter of 02/04/22 (from the past 48 hour(s))  ?Type and screen [place]     Status: None  ? Collection Time: 02/04/22  5:35 AM  ?Result Value Ref Range  ? ABO/RH(D) A POS   ? Antibody Screen NEG   ? Sample Expiration    ?  02/07/2022,2359 ?Performed at [facility], [place] [address], [place], [place] [address] ?  ?ABO/Rh  Status: None (Preliminary result)  ? Collection Time: 02/04/22  5:40 AM  ?Result Value Ref Range  ? ABO/RH(D) PENDING   ? ? ?No results found. ? ?ROS - all of the below systems have been reviewed with the patient and positives are indicated with bold text ?General: chills, fever or night sweats ?Eyes: blurry vision or double vision ?ENT: epistaxis or sore throat ?Allergy/Immunology: itchy/watery eyes or nasal  congestion ?Hematologic/Lymphatic: bleeding problems, blood clots or swollen lymph nodes ?Endocrine: temperature intolerance or unexpected weight changes ?Breast: new or changing breast lumps or nipple discharge ?Resp: cough, shortness of breath, or wheezing ?CV: chest pain or dyspnea on exertion ?GI: as per HPI ?GU: dysuria, trouble voiding, or hematuria ?MSK: joint pain or joint stiffness ?Neuro: TIA or stroke symptoms ?Derm: pruritus and skin lesion changes ?Psych: anxiety and depression ? ?PE ?Blood pressure (!) 142/77, pulse 70, temperature 98.3 ?F (36.8 ?C), temperature source Oral, resp. rate 16, SpO2 94 %. ?Constitutional: NAD; conversant; no deformities ?Eyes: Moist conjunctiva; no lid lag; anicteric; PERRL ?Neck: Trachea midline; no thyromegaly ?Lungs: Normal respiratory effort; no tactile fremitus ?CV: RRR; no palpable thrills; no pitting edema ?GI: Abd soft, NT/ND; no palpable hepatosplenomegaly ?MSK: Normal range of motion of extremities ?Psychiatric: Appropriate affect; alert and oriented x3 ?Lymphatic: No palpable cervical or axillary lymphadenopathy ? ?Results for orders placed or performed during the hospital encounter of 02/04/22 (from the past 48 hour(s))  ?Type and screen [place]     Status: None  ? Collection Time: 02/04/22  5:35 AM  ?Result Value Ref Range  ? ABO/RH(D) A POS   ? Antibody Screen NEG   ? Sample Expiration    ?  02/07/2022,2359 ?Performed at [facility], [place] [address] [place], [place] [address] ?  ?ABO/Rh     Status: None (Preliminary result)  ? Collection Time: 02/04/22  5:40 AM  ?Result Value Ref Range  ? ABO/RH(D) PENDING   ? ? ?No results found. ? ? ?A/P: ?Jaclyn York is an 66 y.o. female with hx of complicated diverticulitis here for follow-up evaluation of this ? ?-Cscope 11/2021 Dr. Ardis Hughs - clear; evident tattoo from prior sigmoidal polyp on scope removed 2019 at [facility]. ? ?-The anatomy and physiology of the GI tract was  reviewed with the patient. The pathophysiology of complicated diverticulitis was discussed as well with associated pictures. ?-We have discussed various different treatment options going forward. She has a history of complicated diverticulitis. We spent time reviewing what diverticulitis was. We discussed that surgical management diverticulitis is on a case-by-case basis. We discussed with a history of complicated diverticulitis with abscess requiring percutaneous drainage, her risk of recurrent diverticulitis being higher in patients who have not had this issue. We did discuss that this is not a clearly malignant process or cancer and that surgery is typically done for reduction of risk of subsequent attacks of diverticulitis. Her first attack was quite severe. She is motivated to have surgery for the purposes of preventing this degree of problem from occurring again. ? ?-We discussed what surgery would entail. She did have a pelvic abscess that said anterior to her rectosigmoid colon. We did discuss that there could be fibrosis at this level which could necessitate a more distal resection. We spent time discussing what that could entail and postoperative function. ? ?We discussed robotic assisted sigmoidectomy/low anterior resection, flexible sigmoidoscopy, TAP blocks (transversus abdominus plane blocks), cystoscopy/ureteral ICG by [facility]. ? ?Nadeen Landau, MD FACS ?[facility]  [facility] ? ?

## 2022-02-04 NOTE — Transfer of Care (Signed)
Immediate Anesthesia Transfer of Care Note ? ?Patient: Jaclyn York ? ?Procedure(s) Performed: XI ROBOTIC ASSISTED LOWER ANTERIOR RESECTION, BILATERAL TAP BLOCK, FIREFLY INJECTION ?FLEXIBLE SIGMOIDOSCOPY ?CYSTOSCOPY with FIREFLY INJECTION ? ?Patient Location: PACU ? ?Anesthesia Type:General ? ?Level of Consciousness: drowsy ? ?Airway & Oxygen Therapy: Patient Spontanous Breathing and Patient connected to face mask oxygen ? ?Post-op Assessment: Report given to RN and Post -op Vital signs reviewed and stable ? ?Post vital signs: Reviewed and stable ? ?Last Vitals:  ?Vitals Value Taken Time  ?BP 117/61 02/04/22 1030  ?Temp    ?Pulse 72 02/04/22 1030  ?Resp 17 02/04/22 1030  ?SpO2 100 % 02/04/22 1030  ? ? ?Last Pain:  ?Vitals:  ? 02/04/22 0552  ?TempSrc: Oral  ?   ? ?  ? ?Complications: No notable events documented. ?

## 2022-02-05 ENCOUNTER — Other Ambulatory Visit: Payer: Self-pay

## 2022-02-05 ENCOUNTER — Other Ambulatory Visit (HOSPITAL_COMMUNITY): Payer: Self-pay

## 2022-02-05 ENCOUNTER — Encounter (HOSPITAL_COMMUNITY): Payer: Self-pay | Admitting: Surgery

## 2022-02-05 LAB — BASIC METABOLIC PANEL
Anion gap: 6 (ref 5–15)
BUN: 12 mg/dL (ref 8–23)
CO2: 26 mmol/L (ref 22–32)
Calcium: 8.7 mg/dL — ABNORMAL LOW (ref 8.9–10.3)
Chloride: 106 mmol/L (ref 98–111)
Creatinine, Ser: 0.64 mg/dL (ref 0.44–1.00)
GFR, Estimated: >60 mL/min (ref >60)
Glucose, Bld: 109 mg/dL — ABNORMAL HIGH (ref 70–99)
Potassium: 4.2 mmol/L (ref 3.5–5.1)
Sodium: 138 mmol/L (ref 135–145)

## 2022-02-05 LAB — CBC
HCT: 38.2 % (ref 36.0–46.0)
Hemoglobin: 13 g/dL (ref 12.0–15.0)
MCH: 31.4 pg (ref 26.0–34.0)
MCHC: 34 g/dL (ref 30.0–36.0)
MCV: 92.3 fL (ref 80.0–100.0)
Platelets: 179 10*3/uL (ref 150–400)
RBC: 4.14 MIL/uL (ref 3.87–5.11)
RDW: 12.7 % (ref 11.5–15.5)
WBC: 16.7 10*3/uL — ABNORMAL HIGH (ref 4.0–10.5)
nRBC: 0 % (ref 0.0–0.2)

## 2022-02-05 MED ORDER — BOOST / RESOURCE BREEZE PO LIQD CUSTOM
1.0000 | Freq: Three times a day (TID) | ORAL | Status: DC
  Administered 2022-02-05: 1 via ORAL

## 2022-02-05 MED ORDER — ADULT MULTIVITAMIN W/MINERALS CH
1.0000 | ORAL_TABLET | Freq: Every day | ORAL | Status: DC
  Administered 2022-02-05: 1 via ORAL
  Filled 2022-02-05: qty 1

## 2022-02-05 MED ORDER — TRAMADOL HCL 50 MG PO TABS
50.0000 mg | ORAL_TABLET | Freq: Four times a day (QID) | ORAL | 0 refills | Status: AC | PRN
  Filled 2022-02-05: qty 15, 4d supply, fill #0

## 2022-02-05 NOTE — Progress Notes (Signed)
?  Subjective ?No acute events. Doing great. No complaints. Tolerating lots of liquids without issue. Hungry. Passing flatus and having Bms. Pain well controlled. Ambulating ? ?Objective: ?Vital signs in last 24 hours: ?Temp:  [97.4 ?F (36.3 ?C)-98.5 ?F (36.9 ?C)] 97.7 ?F (36.5 ?C) (04/21 0533) ?Pulse Rate:  [55-84] 55 (04/21 0533) ?Resp:  [10-23] 18 (04/21 0533) ?BP: (88-129)/(51-70) 102/53 (04/21 0533) ?SpO2:  [93 %-100 %] 98 % (04/21 0533) ?Weight:  [76.4 kg-80.4 kg] 76.4 kg (04/21 0526) ?Last BM Date : 02/05/22 ? ?Intake/Output from previous day: ?04/20 0701 - 04/21 0700 ?In: 2824.5 [P.O.:720; I.V.:2004.5; IV Piggyback:100] ?Out: 0174 [Urine:3830; Stool:1; Blood:25] ?Intake/Output this shift: ?Total I/O ?In: 639 [I.V.:639] ?Out: 200 [Urine:200] ? ?Gen: NAD, comfortable ?CV: RRR ?Pulm: Normal work of breathing ?Abd: Soft, NT/ND. Incisions c/d/I without erythema or drainage ?Ext: SCDs in place ? ?Lab Results: ?CBC  ?Recent Labs  ?  02/05/22 ?0416  ?WBC 16.7*  ?HGB 13.0  ?HCT 38.2  ?PLT 179  ? ?BMET ?Recent Labs  ?  02/05/22 ?0416  ?NA 138  ?K 4.2  ?CL 106  ?CO2 26  ?GLUCOSE 109*  ?BUN 12  ?CREATININE 0.64  ?CALCIUM 8.7*  ? ?PT/INR ?No results for input(s): LABPROT, INR in the last 72 hours. ?ABG ?No results for input(s): PHART, HCO3 in the last 72 hours. ? ?Invalid input(s): PCO2, PO2 ? ?Studies/Results: ? ?Anti-infectives: ?Anti-infectives (From admission, onward)  ? ? Start     Dose/Rate Route Frequency Ordered Stop  ? 02/04/22 1400  neomycin (MYCIFRADIN) tablet 1,000 mg  Status:  Discontinued       ?See Hyperspace for full Linked Orders Report.  ? 1,000 mg Oral 3 times per day 02/04/22 0522 02/04/22 0524  ? 02/04/22 1400  metroNIDAZOLE (FLAGYL) tablet 1,000 mg  Status:  Discontinued       ?See Hyperspace for full Linked Orders Report.  ? 1,000 mg Oral 3 times per day 02/04/22 0522 02/04/22 0524  ? 02/04/22 0600  cefoTEtan (CEFOTAN) 2 g in sodium chloride 0.9 % 100 mL IVPB       ? 2 g ?200 mL/hr over 30 Minutes  Intravenous On call to O.R. 02/04/22 0522 02/04/22 0825  ? ?  ? ? ? ?Assessment/Plan: ?Patient Active Problem List  ? Diagnosis Date Noted  ? S/P laparoscopic-assisted sigmoidectomy 02/04/2022  ? Sigmoid diverticulitis 08/29/2021  ? Ileus ([place]) 08/29/2021  ? Dehydration 08/29/2021  ? ?s/p Procedure(s): ?XI ROBOTIC ASSISTED LOWER ANTERIOR RESECTION, BILATERAL TAP BLOCK, FIREFLY INJECTION ?FLEXIBLE SIGMOIDOSCOPY ?CYSTOSCOPY with FIREFLY INJECTION 02/04/2022 ? ?-Doing great ?-Spent time reviewing procedure, findings and plans moving forward. Reviewed postoperative expectations and things to watch out for ?-D/C IVF ?-D/C Entereg ?-Ambulate 5x/day ?-PPX: SQH, SCDs ? ? LOS: 1 day  ? ?Nadeen Landau, MD FACS ?[facility] ?

## 2022-02-05 NOTE — Progress Notes (Signed)
Transition of Care (TOC) Screening Note ? ?Patient Details  ?Name: Daphne Karrer ?Date of Birth: 10-Aug-1956 ? ?Transition of Care (TOC) CM/SW Contact:    ?Sherie Don, LCSW ?Phone Number: ?02/05/2022, 10:38 AM ? ?Transition of Care Department [facility]) has reviewed patient and no TOC needs have been identified at this time. We will continue to monitor patient advancement through interdisciplinary progression rounds. If new patient transition needs arise, please place a TOC consult. ?

## 2022-02-05 NOTE — Plan of Care (Signed)

## 2022-02-05 NOTE — Progress Notes (Signed)
Initial Nutrition Assessment ? ?INTERVENTION:  ? ?-Ensure Surgery PO BID, each provides 330 kcals and 18g protein  ? ?-D/c Boost Breeze ? ?-Multivitamin with minerals daily ? ?NUTRITION DIAGNOSIS:  ? ?Increased nutrient needs related to post-op healing, acute illness as evidenced by estimated needs. ? ?GOAL:  ? ?Patient will meet greater than or equal to 90% of their needs ? ?MONITOR:  ? ?PO intake, Supplement acceptance, Labs, Weight trends, I & O's ? ?REASON FOR ASSESSMENT:  ? ?Malnutrition Screening Tool ?  ? ?ASSESSMENT:  ? ?66 year old female with a history of complicated diverticulitis presents for colectomy and low anterior resection. ? ?Patient reports eating well today. Had liquids this morning but tolerated some eggs and grits now that diet is advanced. ?Pt states she has been dealing with diverticulosis/diverticulitis since December 2022. Pt followed a low fiber diet for a month following her last flare and then began to eat normally. Reports eating a lot of peanuts.  ?Pt did drink Ensure at that time as well for additional protein. Will continue given elevated post-op needs.  ? ?Per weight records, pt has lost 12 lbs since 09/07/21 (6% wt loss x 5 months, insignificant for time frame).  ? ?Medications: Lactated ringers ? ?Labs reviewed. ? ?NUTRITION - FOCUSED PHYSICAL EXAM: ? ?No depletions noted. ? ?Diet Order:   ?Diet Order   ? ?       ?  DIET SOFT Room service appropriate? Yes; Fluid consistency: Thin  Diet effective now       ?  ? ?  ?  ? ?  ? ? ?EDUCATION NEEDS:  ? ?Education needs have been addressed ? ?Skin:  Skin Assessment: Skin Integrity Issues: ?Skin Integrity Issues:: Incisions ?Incisions: 4/20 abdomen ? ?Last BM:  4/21 -type 7 ? ?Height:  ? ?Ht Readings from Last 1 Encounters:  ?02/05/22 '5\' 8"'$  (1.727 m)  ? ? ?Weight:  ? ?Wt Readings from Last 1 Encounters:  ?02/05/22 76.4 kg  ? ? ?BMI:  Body mass index is 25.61 kg/m?. ? ?Estimated Nutritional Needs:  ? ?Kcal:  1800-2000 ? ?Protein:   85-95g ? ?Fluid:  2L/day ? ?Clayton Bibles, MS, RD, LDN ?Inpatient Clinical Dietitian ?Contact information available via Amion ? ?

## 2022-02-05 NOTE — Discharge Instructions (Addendum)
POST OP INSTRUCTIONS AFTER COLON SURGERY  DIET: Be sure to include lots of fluids daily to stay hydrated - 64oz of water per day (8, 8 oz glasses).  Avoid fast food or heavy meals for the first couple of weeks as your are more likely to get nauseated. Avoid raw/uncooked fruits or vegetables for the first 4 weeks (its ok to have these if they are blended into smoothie form). If you have fruits/vegetables, make sure they are cooked until soft enough to mash on the roof of your mouth and chew your food well. Otherwise, diet as tolerated.  Take your usually prescribed home medications unless otherwise directed.  PAIN CONTROL: Pain is best controlled by a usual combination of three different methods TOGETHER: Ice/Heat Over the counter pain medication Prescription pain medication Most patients will experience some swelling and bruising around the surgical site.  Ice packs or heating pads (30-60 minutes up to 6 times a day) will help. Some people prefer to use ice alone, heat alone, alternating between ice & heat.  Experiment to what works for you.  Swelling and bruising can take several weeks to resolve.   It is helpful to take an over-the-counter pain medication regularly for the first few weeks: Ibuprofen (Motrin/Advil) - 200mg tabs - take 3 tabs (600mg) every 6 hours as needed for pain (unless you have been directed previously to avoid NSAIDs/ibuprofen) Acetaminophen (Tylenol) - you may take 650mg every 6 hours as needed. You can take this with motrin as they act differently on the body. If you are taking a narcotic pain medication that has acetaminophen in it, do not take over the counter tylenol at the same time. NOTE: You may take both of these medications together - most patients  find it most helpful when alternating between the two (i.e. Ibuprofen at 6am, tylenol at 9am, ibuprofen at 12pm ...) A  prescription for pain medication should be given to you upon discharge.  Take your pain medication as  prescribed if your pain is not adequatly controlled with the over-the-counter pain reliefs mentioned above.  Avoid getting constipated.  Between the surgery and the pain medications, it is common to experience some constipation.  Increasing fluid intake and taking a fiber supplement (such as Metamucil, Citrucel, FiberCon, MiraLax, etc) 1-2 times a day regularly will usually help prevent this problem from occurring.  A mild laxative (prune juice, Milk of Magnesia, MiraLax, etc) should be taken according to package directions if there are no bowel movements after 48 hours.    Dressing: Your incisions are covered in Dermabond which is like sterile superglue for the skin. This will come off on it's own in a couple weeks. It is waterproof and you may bathe normally starting the day after your surgery in a shower. Avoid baths/pools/lakes/oceans until your wounds have fully healed.  ACTIVITIES as tolerated:   Avoid heavy lifting (>10lbs or 1 gallon of milk) for the next 6 weeks. You may resume regular daily activities as tolerated--such as daily self-care, walking, climbing stairs--gradually increasing activities as tolerated.  If you can walk 30 minutes without difficulty, it is safe to try more intense activity such as jogging, treadmill, bicycling, low-impact aerobics.  DO NOT PUSH THROUGH PAIN.  Let pain be your guide: If it hurts to do something, don't do it. You may drive when you are no longer taking prescription pain medication, you can comfortably wear a seatbelt, and you can safely maneuver your car and apply brakes.  FOLLOW UP in our   office Please call [facility] at [phone] to set up an appointment to see your surgeon in the office for a follow-up appointment approximately 2 weeks after your surgery. Make sure that you call for this appointment the day you arrive home to insure a convenient appointment time.  9. If you have disability or family leave forms that need to be completed, you may have  them completed by your primary care physician's office; for return to work instructions, please ask our office staff and they will be happy to assist you in obtaining this documentation   When to call us [phone]: Poor pain control Reactions / problems with new medications (rash/itching, etc)  Fever over 101.5 F (38.5 C) Inability to urinate Nausea/vomiting Worsening swelling or bruising Continued bleeding from incision. Increased pain, redness, or drainage from the incision  The clinic staff is available to answer your questions during regular business hours (8:30am-5pm).  Please don't hesitate to call and ask to speak to one of our nurses for clinical concerns.   A surgeon from [facility] is always on call at the hospitals   If you have a medical emergency, go to the nearest emergency room or call 911.  [facility] [address], [address], [place],   [address] MAIN: [phone] FAX: [phone] [email or web address]  

## 2022-02-06 ENCOUNTER — Other Ambulatory Visit (HOSPITAL_COMMUNITY): Payer: Self-pay

## 2022-02-06 DIAGNOSIS — G8929 Other chronic pain: Secondary | ICD-10-CM | POA: Insufficient documentation

## 2022-02-06 DIAGNOSIS — L309 Dermatitis, unspecified: Secondary | ICD-10-CM | POA: Diagnosis present

## 2022-02-06 DIAGNOSIS — M1990 Unspecified osteoarthritis, unspecified site: Secondary | ICD-10-CM | POA: Insufficient documentation

## 2022-02-06 DIAGNOSIS — J309 Allergic rhinitis, unspecified: Secondary | ICD-10-CM | POA: Diagnosis present

## 2022-02-06 DIAGNOSIS — K589 Irritable bowel syndrome without diarrhea: Secondary | ICD-10-CM | POA: Diagnosis present

## 2022-02-06 LAB — BASIC METABOLIC PANEL
Anion gap: 4 — ABNORMAL LOW (ref 5–15)
BUN: 12 mg/dL (ref 8–23)
CO2: 28 mmol/L (ref 22–32)
Calcium: 8.2 mg/dL — ABNORMAL LOW (ref 8.9–10.3)
Chloride: 106 mmol/L (ref 98–111)
Creatinine, Ser: 0.8 mg/dL (ref 0.44–1.00)
GFR, Estimated: >60 mL/min (ref >60)
Glucose, Bld: 87 mg/dL (ref 70–99)
Potassium: 3.8 mmol/L (ref 3.5–5.1)
Sodium: 138 mmol/L (ref 135–145)

## 2022-02-06 LAB — CBC
HCT: 36 % (ref 36.0–46.0)
Hemoglobin: 11.8 g/dL — ABNORMAL LOW (ref 12.0–15.0)
MCH: 31.3 pg (ref 26.0–34.0)
MCHC: 32.8 g/dL (ref 30.0–36.0)
MCV: 95.5 fL (ref 80.0–100.0)
Platelets: 162 10*3/uL (ref 150–400)
RBC: 3.77 MIL/uL — ABNORMAL LOW (ref 3.87–5.11)
RDW: 13.1 % (ref 11.5–15.5)
WBC: 9.5 10*3/uL (ref 4.0–10.5)
nRBC: 0 % (ref 0.0–0.2)

## 2022-02-06 NOTE — Progress Notes (Signed)
Nurse reviewed discharge instructions with pt.  Pt verbalized understanding of discharge instructions, follow up appointment and new medication.  The outpatient pharmacy delivered pt medications prior to discharge. Pt husband to pick up pt. ?

## 2022-02-06 NOTE — Discharge Summary (Signed)
Physician Discharge Summary  ? ? ?Patient ID: ?Jaclyn York ?MRN: [id number] ?DOB/AGE: Jan 29, 1956  ?66 y.o. ? ?Patient Care Team: ?Wayland Salinas, MD as PCP - General (Family Medicine) ? ?Admit date: 02/04/2022 ? ?Discharge date: 02/06/2022 ? ?Hospital Stay = 2 days ? ? ? ?Discharge Diagnoses:  ?Principal Problem: ?  Diverticulitis s/p robotic sigmoid colectomy 02/04/2022 ?Active Problems: ?  S/P laparoscopic-assisted sigmoidectomy ?  Allergic rhinitis ?  Eczema ?  IBS (irritable bowel syndrome) ? ? ?2 Days Post-Op  02/04/2022 ? ?POSTOP DIAGNOSIS: DIVERTICULITIS ?  ?PROCEDURE:  ?Robotic assisted low anterior resection with double stapled colorectal anastomosis ?Intraoperative assessment of perfusion using ICG fluorescence imaging ?Flexible sigmoidoscopy ?Bilateral transversus abdominus plane (TAP) blocks ?  ?SURGEON: Sharon Mt. White, MD ?  ?FINDINGS: Significant scarring of the mid and distal sigmoid consistent with history. Pelvic adhesions between sigmoid colon, proximal most rectum and the tubes/ovaries. These were all taken down. The distal point of transection was at the proximal rectum just below the diseased segment. A well perfused, tension free, hemostatic, air tight 29 mm EEA colorectal anastomosis fashioned 14 cm from the anal verge by flexible sigmoidoscopy. ?  ? ?################## ? ?Preoperative diagnosis:  ?Pelvic Abscess       ? Diverticulitis ?Postoperative diagnosis:  ?Same ?  ?Procedure: ?Cystoscopy ?Instillation of ureteral firefly constrast ?  ?Surgeon: Ardis Hughs, MD ? ?############################## ? ? ? ?Consults: Urology ? ?Hospital Course:  ? ?Pleasant patient found to have episodes of diverticulitis with scarring and stricturing.  Felt to benefit from segmental resection by Dr. Dema Severin.  The patient underwent the surgery above.  Postoperatively, the patient gradually mobilized and advanced to a solid diet.  Pain and other symptoms were treated aggressively.   ? ?By the  time of discharge, the patient was walking well the hallways, eating food, having flatus.  Pain was well-controlled on an oral medications.  Based on meeting discharge criteria and continuing to recover, I felt it was safe for the patient to be discharged from the hospital to further recover with close followup. Postoperative recommendations were discussed in detail.  They are written as well. ? ?Discharged Condition: good ? ?Discharge Exam: ?Blood pressure (!) 112/58, pulse 66, temperature 98.5 ?F (36.9 ?C), temperature source Oral, resp. rate 16, height '5\' 8"'$  (1.727 m), weight 78 kg, SpO2 93 %. ? ?General: Pt awake/alert/oriented x4 in No acute distress ?Eyes: PERRL, normal EOM.  Sclera clear.  No icterus ?Neuro: CN II-XII intact w/o focal sensory/motor deficits. ?Lymph: No head/neck/groin lymphadenopathy ?Psych:  No delerium/psychosis/paranoia ?HENT: Normocephalic, Mucus membranes moist.  No thrush ?Neck: Supple, No tracheal deviation ?Chest:  No chest wall pain w good excursion ?CV:  Pulses intact.  Regular rhythm ?MS: Normal AROM mjr joints.  No obvious deformity ?Abdomen: Soft.  Nondistended.  Mildly tender at incisions only.  Dressings clear and dry and intact with Dermabond in place.  No leaking cellulitis or abscess or hematoma nor ecchymosis.  No evidence of peritonitis.  No incarcerated hernias. ?Ext:  SCDs BLE.  No mjr edema.  No cyanosis ?Skin: No petechiae / purpura ? ? ?Disposition:  ? ? Follow-up Information   ? ? Ileana Roup, MD Follow up in 2 week(s).   ?Specialties: General Surgery, Colon and Rectal Surgery ?Why: ~2-3 weeks for postop appointment ?Contact information: ?[address] ?[address] ?[place] [phone] ?[phone] ? ? ?  ?  ? ?  ?  ? ?  ? ? ?Discharge disposition: 01-Home or Self Care ? ? ? ? ? ? ?  Discharge Instructions   ? ? Call MD for:   Complete by: As directed ?  ? FEVER > 101.5 F  ?(temperatures < 101.5 F are not significant)  ? Call MD for:  extreme  fatigue   Complete by: As directed ?  ? Call MD for:  persistant dizziness or light-headedness   Complete by: As directed ?  ? Call MD for:  persistant nausea and vomiting   Complete by: As directed ?  ? Call MD for:  redness, tenderness, or signs of infection (pain, swelling, redness, odor or green/yellow discharge around incision site)   Complete by: As directed ?  ? Call MD for:  severe uncontrolled pain   Complete by: As directed ?  ? Diet - low sodium heart healthy   Complete by: As directed ?  ? Start with a bland diet such as soups, liquids, starchy foods, low fat foods, etc. the first few days at home. ?Gradually advance to a solid, low-fat, high fiber diet by the end of the first week at home.   ?Add a fiber supplement to your diet (Metamucil, etc) ?If you feel full, bloated, or constipated, stay on a full liquid or pureed/blenderized diet for a few days until you feel better and are no longer constipated.  ? Discharge instructions   Complete by: As directed ?  ? See Discharge Instructions ?If you are not getting better after two weeks or are noticing you are getting worse, contact our office [phone] for further advice.  We may need to adjust your medications, re-evaluate you in the office, send you to the emergency room, or see what other things we can do to help. ?The clinic staff is available to answer your questions during regular business hours (8:30am-5pm).  Please don't hesitate to call and ask to speak to one of our nurses for clinical concerns.    ?A surgeon from [facility] is always on call at the hospitals 24 hours/day ?If you have a medical emergency, go to the nearest emergency room or call 911.  ? Discharge wound care:   Complete by: As directed ?  ? It is good for closed incisions and even open wounds to be washed every day.  Shower every day.  Short baths are fine.  Wash the incisions and wounds clean with soap & water.    ?You may leave closed incisions open to air if  it is dry.   You may cover the incision with clean gauze & replace it after your daily shower for comfort. ? ?DERMABOND:  You have purple skin glue (Dermabond) on your incision(s).  Leave them in place, and they will fall off on their own like a scab in 2-3 weeks.  You may trim any edges that curl up with clean scissors.  ? Driving Restrictions   Complete by: As directed ?  ? You may drive when: ?- you are no longer taking narcotic prescription pain medication ?- you can comfortably wear a seatbelt ?- you can safely make sudden turns/stops without pain.  ? Increase activity slowly   Complete by: As directed ?  ? Start light daily activities --- self-care, walking, climbing stairs- beginning the day after surgery.  Gradually increase activities as tolerated.  Control your pain to be active.  Stop when you are tired.  Ideally, walk several times a day, eventually an hour a day.   ?Most people are back to most day-to-day activities in a few weeks.  It takes  4-6 weeks to get back to unrestricted, intense activity. ?If you can walk 30 minutes without difficulty, it is safe to try more intense activity such as jogging, treadmill, bicycling, low-impact aerobics, swimming, etc. ?Save the most intensive and strenuous activity for last (Usually 4-8 weeks after surgery) such as sit-ups, heavy lifting, contact sports, etc.  Refrain from any intense heavy lifting or straining until you are off narcotics for pain control.  You will have off days, but things should improve week-by-week. ?DO NOT PUSH THROUGH PAIN.  Let pain be your guide: If it hurts to do something, don't do it.  ? Lifting restrictions   Complete by: As directed ?  ? If you can walk 30 minutes without difficulty, it is safe to try more intense activity such as jogging, treadmill, bicycling, low-impact aerobics, swimming, etc. ?Save the most intensive and strenuous activity for last (Usually 4-8 weeks after surgery) such as sit-ups, heavy lifting, contact sports,  etc.   ?Refrain from any intense heavy lifting or straining until you are off narcotics for pain control.  You will have off days, but things should improve week-by-week. ?DO NOT PUSH THROUGH PAIN.  Let pain be

## 2022-02-08 LAB — SURGICAL PATHOLOGY

## 2022-05-24 ENCOUNTER — Other Ambulatory Visit (HOSPITAL_COMMUNITY): Payer: Self-pay

## 2023-07-31 IMAGING — CT CT ABD-PELV W/ CM
2 of 5 series · 16 of 46 positions shown, 18 images · IV contrast (agent unspecified)
Comparison: 09/17/2021 CT and the catheter injection of 09/30/2021

CLINICAL DATA: History of diverticulitis and abscess with recent
drain. Persistent tenderness.

EXAM:
CT ABDOMEN AND PELVIS WITH CONTRAST
TECHNIQUE: Multidetector CT imaging of the abdomen and pelvis was performed
using the standard protocol following bolus administration of
intravenous contrast.

[Series 2: abd/pel w · axial · 0.77mm/px · z∈[-512,-102]mm · 13 of 94 slices shown, 15 images]
[im 6/94  soft-tissue]
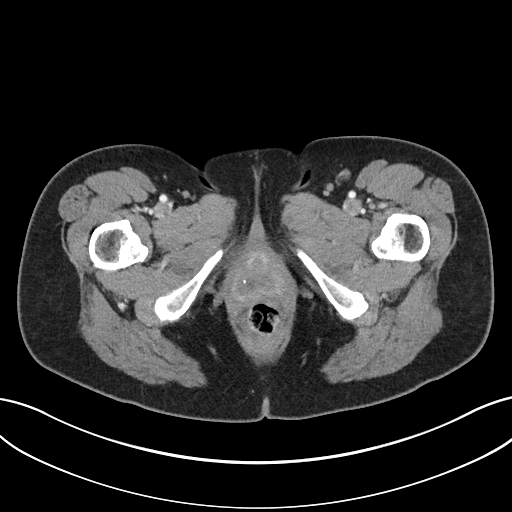
[im 6/94  bone]
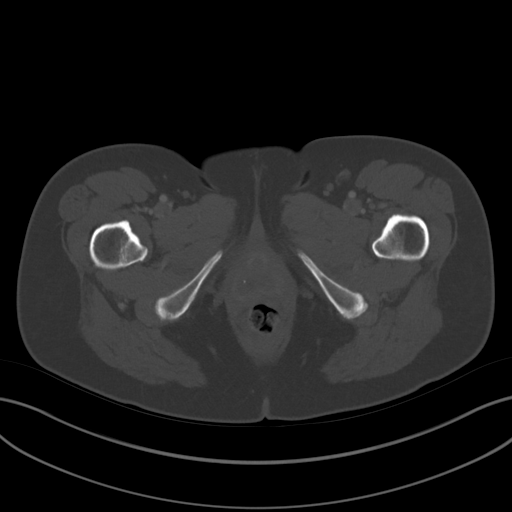
[im 11/94  soft-tissue]
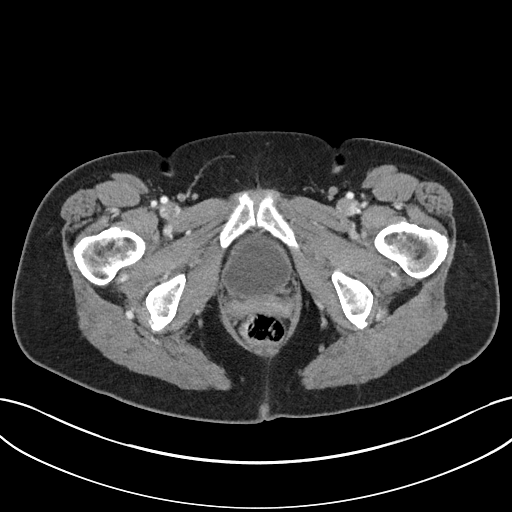
[im 21/94  soft-tissue]
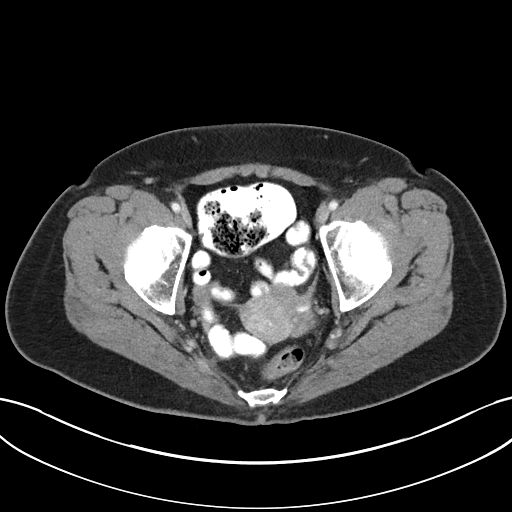
[im 26/94  soft-tissue]
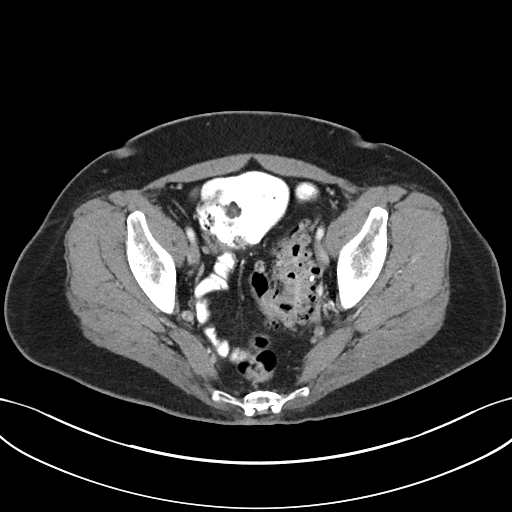
[im 32/94  soft-tissue]
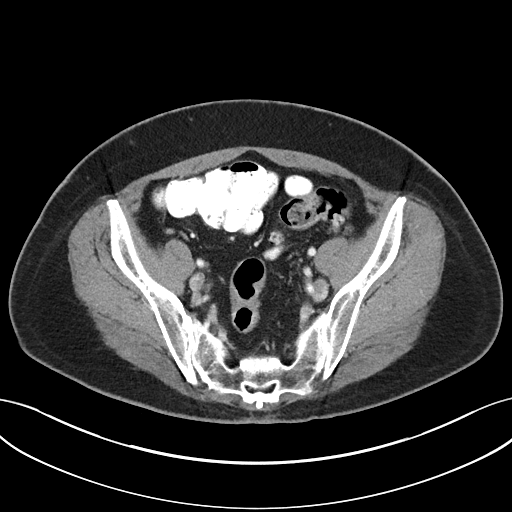
[im 42/94  soft-tissue]
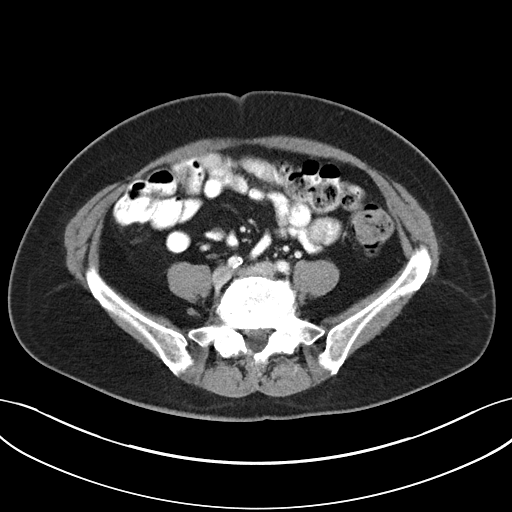
[im 47/94  soft-tissue]
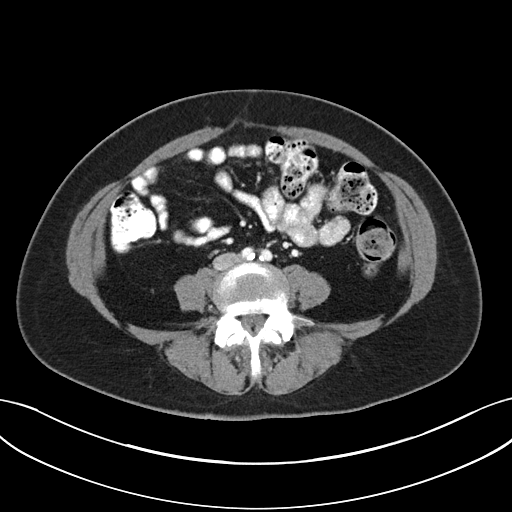
[im 52/94  soft-tissue]
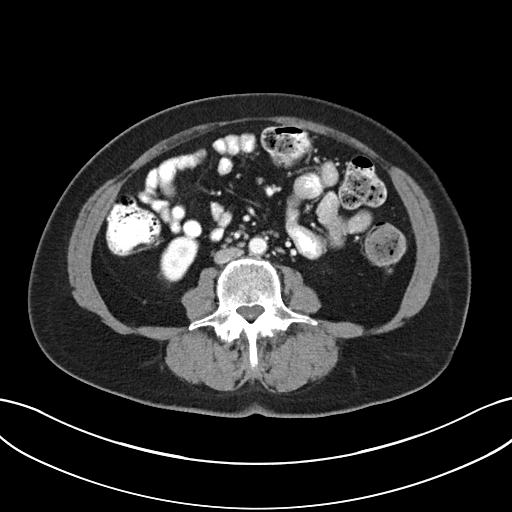
[im 63/94  soft-tissue]
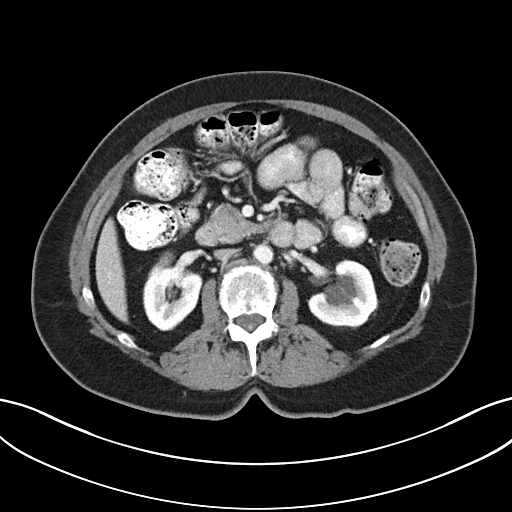
[im 63/94  bone]
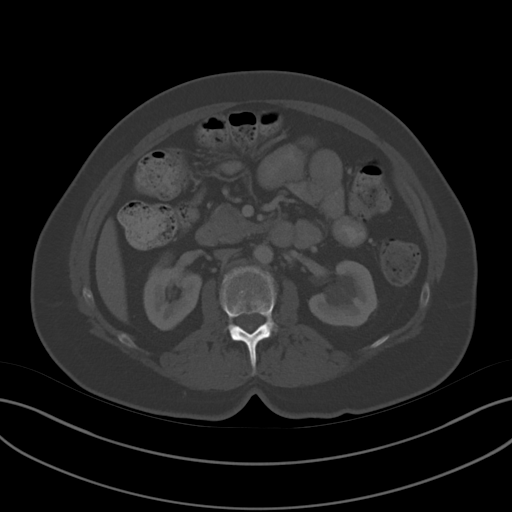
[im 68/94  soft-tissue]
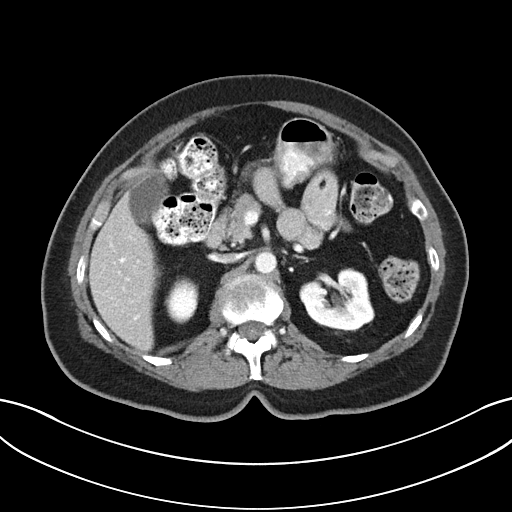
[im 73/94  soft-tissue]
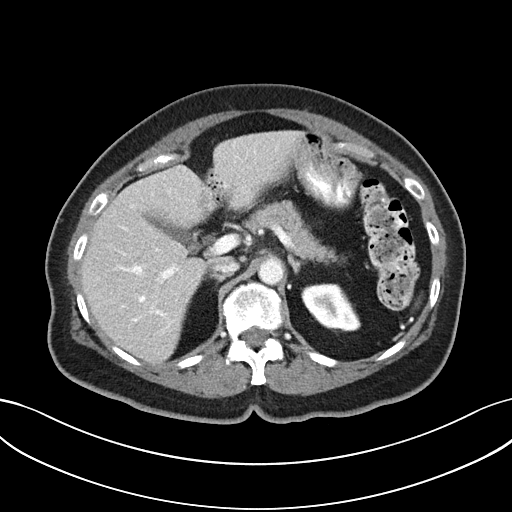
[im 83/94  soft-tissue]
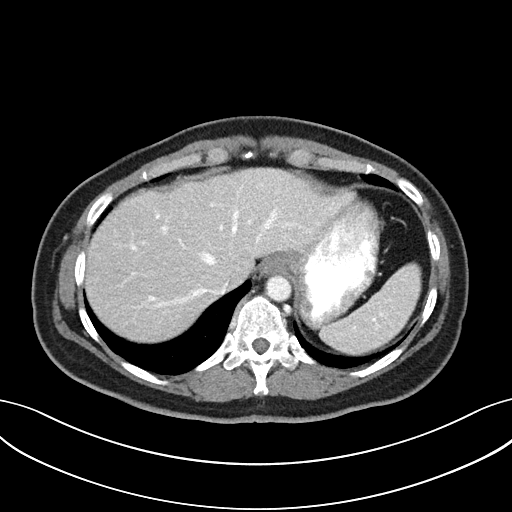
[im 88/94  soft-tissue]
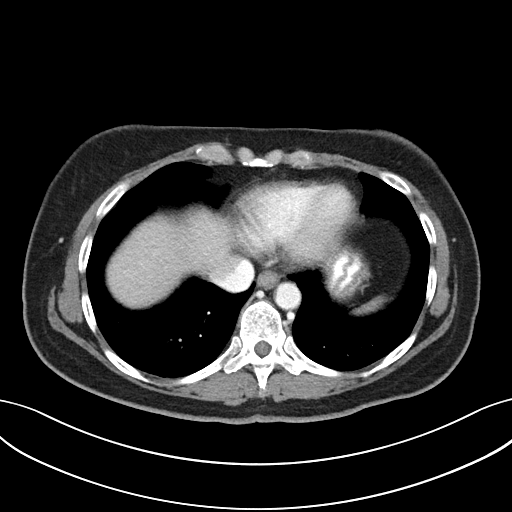

[Series 5: coronal st · coronal · 0.79mm/px · 3 of 99 slices shown]
[im 33/99  soft-tissue]
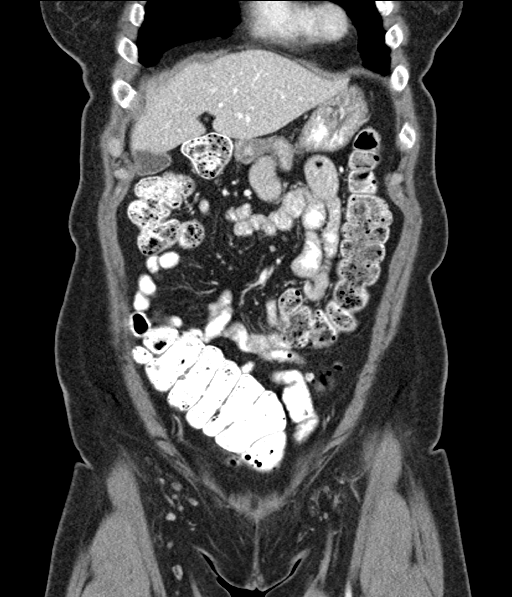
[im 44/99  soft-tissue]
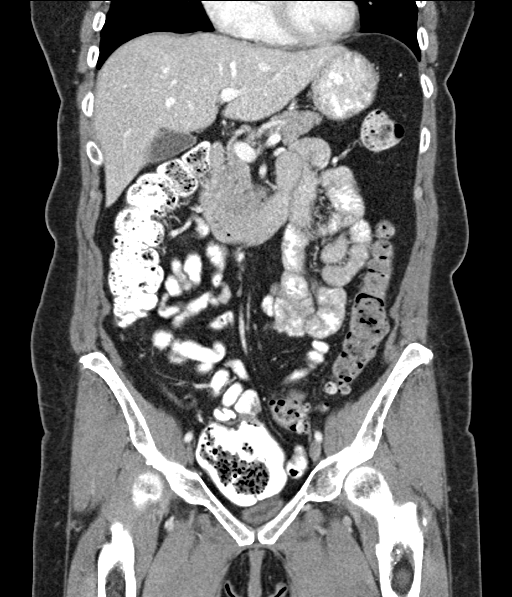
[im 55/99  soft-tissue]
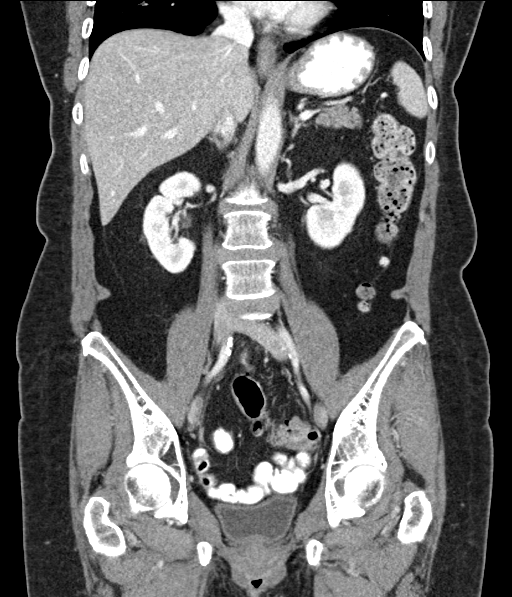

[16 of 46 positions shown; findings below may reference images not displayed]

RADIATION DOSE REDUCTION: This exam was performed according to the
departmental dose-optimization program which includes automated
exposure control, adjustment of the mA and/or kV according to
patient size and/or use of iterative reconstruction technique.

CONTRAST:  100mL OMNIPAQUE IOHEXOL 300 MG/ML  SOLN
FINDINGS: Lower chest: Clear lung bases. Normal heart size without pericardial
or pleural effusion.

Hepatobiliary: Normal liver. Normal gallbladder, without biliary
ductal dilatation.

Pancreas: Normal, without mass or ductal dilatation.

Spleen: Normal in size, without focal abnormality.

Adrenals/Urinary Tract: Normal adrenal glands. Left renal sinus
cysts. Normal right kidney. Normal urinary bladder.

Stomach/Bowel: Normal stomach, without wall thickening. Scattered
colonic diverticula. Wall thickening within the sigmoid persists,
and is likely due to muscular hypertrophy. No evidence of residual
or recurrent inflammation.

Normal terminal ileum.  Retrocecal appendix.  Normal small bowel.

Vascular/Lymphatic: Aortic atherosclerosis. No abdominopelvic
adenopathy.

Reproductive: Fibroid uterus, including a posterior left body lesion
of 2.5 cm on 75/2. No adnexal mass.

Other: No residual or recurrent fluid collection. No extraluminal
gas.

Mild pelvic floor laxity.

Musculoskeletal: Degenerative disc disease at the lumbosacral
junction.
IMPRESSION: 1. Interval removal of the pelvic cul-de-sac percutaneous drain. No
evidence of recurrent/residual diverticulitis or abscess.
2. Colonic diverticulosis with sigmoid wall thickening, likely
related to muscular hypertrophy.
3. Fibroid uterus.
4.  Aortic Atherosclerosis ([id number]).
